# Patient Record
Sex: Male | Born: 1955 | Race: White | Hispanic: No | Smoking: Former smoker
Health system: Southern US, Community
[De-identification: ages and names within clinical notes are randomized; demographics above are authoritative.]

## PROBLEM LIST (undated history)

## (undated) DIAGNOSIS — M109 Gout, unspecified: Secondary | ICD-10-CM

## (undated) DIAGNOSIS — H269 Unspecified cataract: Secondary | ICD-10-CM

## (undated) DIAGNOSIS — G43909 Migraine, unspecified, not intractable, without status migrainosus: Secondary | ICD-10-CM

## (undated) DIAGNOSIS — N200 Calculus of kidney: Secondary | ICD-10-CM

## (undated) DIAGNOSIS — T7840XA Allergy, unspecified, initial encounter: Secondary | ICD-10-CM

## (undated) DIAGNOSIS — K5900 Constipation, unspecified: Secondary | ICD-10-CM

## (undated) DIAGNOSIS — F419 Anxiety disorder, unspecified: Secondary | ICD-10-CM

## (undated) DIAGNOSIS — G4730 Sleep apnea, unspecified: Secondary | ICD-10-CM

## (undated) DIAGNOSIS — D649 Anemia, unspecified: Secondary | ICD-10-CM

## (undated) DIAGNOSIS — F329 Major depressive disorder, single episode, unspecified: Secondary | ICD-10-CM

## (undated) DIAGNOSIS — F909 Attention-deficit hyperactivity disorder, unspecified type: Secondary | ICD-10-CM

## (undated) DIAGNOSIS — F32A Depression, unspecified: Secondary | ICD-10-CM

## (undated) HISTORY — DX: Allergy, unspecified, initial encounter: T78.40XA

## (undated) HISTORY — DX: Sleep apnea, unspecified: G47.30

## (undated) HISTORY — DX: Major depressive disorder, single episode, unspecified: F32.9

## (undated) HISTORY — DX: Attention-deficit hyperactivity disorder, unspecified type: F90.9

## (undated) HISTORY — DX: Constipation, unspecified: K59.00

## (undated) HISTORY — DX: Anxiety disorder, unspecified: F41.9

## (undated) HISTORY — DX: Unspecified cataract: H26.9

## (undated) HISTORY — DX: Depression, unspecified: F32.A

## (undated) HISTORY — DX: Calculus of kidney: N20.0

## (undated) HISTORY — PX: OTHER SURGICAL HISTORY: SHX169

## (undated) HISTORY — PX: REPLACEMENT TOTAL KNEE: SUR1224

## (undated) HISTORY — PX: COLONOSCOPY: SHX174

## (undated) HISTORY — PX: KNEE SURGERY: SHX244

## (undated) HISTORY — DX: Migraine, unspecified, not intractable, without status migrainosus: G43.909

## (undated) HISTORY — DX: Anemia, unspecified: D64.9

---
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
## 1998-07-13 DIAGNOSIS — N200 Calculus of kidney: Secondary | ICD-10-CM

## 1998-07-13 HISTORY — PX: LITHOTRIPSY: SUR834

## 1998-07-13 HISTORY — DX: Calculus of kidney: N20.0

## 1999-07-14 HISTORY — PX: BACK SURGERY: SHX140

## 2007-05-16 DIAGNOSIS — J309 Allergic rhinitis, unspecified: Secondary | ICD-10-CM

## 2007-06-21 DIAGNOSIS — J40 Bronchitis, not specified as acute or chronic: Secondary | ICD-10-CM

## 2008-02-16 DIAGNOSIS — M129 Arthropathy, unspecified: Secondary | ICD-10-CM | POA: Insufficient documentation

## 2008-06-25 DIAGNOSIS — M159 Polyosteoarthritis, unspecified: Secondary | ICD-10-CM

## 2009-06-24 DIAGNOSIS — S46819A Strain of other muscles, fascia and tendons at shoulder and upper arm level, unspecified arm, initial encounter: Secondary | ICD-10-CM

## 2009-06-24 DIAGNOSIS — S43499A Other sprain of unspecified shoulder joint, initial encounter: Secondary | ICD-10-CM

## 2010-10-16 ENCOUNTER — Ambulatory Visit (INDEPENDENT_AMBULATORY_CARE_PROVIDER_SITE_OTHER): Payer: BC Managed Care – PPO | Admitting: Family Medicine

## 2010-10-16 ENCOUNTER — Encounter: Payer: Self-pay | Admitting: Family Medicine

## 2010-10-16 DIAGNOSIS — G43909 Migraine, unspecified, not intractable, without status migrainosus: Secondary | ICD-10-CM

## 2010-10-16 DIAGNOSIS — R42 Dizziness and giddiness: Secondary | ICD-10-CM

## 2010-10-16 DIAGNOSIS — G4700 Insomnia, unspecified: Secondary | ICD-10-CM

## 2010-10-16 DIAGNOSIS — G4733 Obstructive sleep apnea (adult) (pediatric): Secondary | ICD-10-CM

## 2010-10-16 DIAGNOSIS — F5104 Psychophysiologic insomnia: Secondary | ICD-10-CM

## 2010-10-16 DIAGNOSIS — F988 Other specified behavioral and emotional disorders with onset usually occurring in childhood and adolescence: Secondary | ICD-10-CM

## 2010-10-16 NOTE — Patient Instructions (Signed)
Consider complete physical at some point later this year. 

## 2010-10-16 NOTE — Progress Notes (Signed)
  Subjective:    Patient ID: Ruben King, male    DOB: 1956-01-07, 55 y.o.   MRN: [id number]  HPI Patient seen to establish care. Past medical history reviewed. Prior history of kidney stones, migraine headaches, chronic insomnia, reported obstructive sleep apnea, and attention deficit disorder. He continues to see psychiatrist for all his medications.  Back surgery 2001 for some type of benign growth. 3 prior arthroscopic knee surgeries.  Family history significant for mother with bypass age 32. Father died of lung cancer. Mother with type 2 diabetes.  Patient is divorced. 2 stepchildren. Quit smoking 2 years ago. Occasional alcohol use. Colonoscopy 2008. Tetanus 2004. Patient works in [employer]  Acute issue of some intermittent vertigo past couple of weeks. Symptoms are very transient. No hearing changes. No tinnitus. No ataxia. No focal weakness. Denies headache.   Review of Systems  Constitutional: Negative for fever, chills, appetite change and fatigue.  HENT: Negative for hearing loss, congestion and tinnitus.   Eyes: Negative for visual disturbance.  Respiratory: Negative for cough and shortness of breath.   Cardiovascular: Negative for chest pain, palpitations and leg swelling.  Gastrointestinal: Negative for nausea, vomiting, abdominal pain, diarrhea and blood in stool.  Genitourinary: Negative for dysuria and hematuria.  Musculoskeletal: Negative for back pain, arthralgias and gait problem.  Skin: Negative for rash.  Neurological: Negative for dizziness, syncope and headaches.  Hematological: Negative for adenopathy.  Psychiatric/Behavioral: Positive for sleep disturbance. Negative for confusion and dysphoric mood.       Objective:   Physical Exam  [vitalsreviewed. Constitutional: He is oriented to person, place, and time. He appears well-developed and well-nourished.  HENT:  Head: Normocephalic and atraumatic.  Right Ear: External ear normal.  Left Ear:  External ear normal.  Mouth/Throat: Oropharynx is clear and moist. No oropharyngeal exudate.  Eyes: Pupils are equal, round, and reactive to light.  Neck: Neck supple. No thyromegaly present.  Cardiovascular: Normal rate, regular rhythm and normal heart sounds.   No murmur heard. Pulmonary/Chest: Effort normal and breath sounds normal. He has no wheezes. He has no rales. He exhibits no tenderness.  Abdominal: Soft. He exhibits no mass. There is no tenderness.  Musculoskeletal: He exhibits no edema.  Lymphadenopathy:    He has no cervical adenopathy.  Neurological: He is alert and oriented to person, place, and time. No cranial nerve deficit.       Cerebellar normal by finger to nose testing.  Skin: No rash noted.  Psychiatric: He has a normal mood and affect. His behavior is normal.          Assessment & Plan:  #1 attention deficit disorder #2 history of kidney stones #3 history of migraine headaches #4 history of chronic insomnia #5 reported history of obstructive sleep apnea #6 transient vertigo, suspect benign positional vertigo  Recent labs from outside reviewed. Patient will consider complete physical some point later this year. Discussed importance of weight loss.

## 2010-10-17 ENCOUNTER — Encounter: Payer: Self-pay | Admitting: Family Medicine

## 2010-10-17 DIAGNOSIS — G4733 Obstructive sleep apnea (adult) (pediatric): Secondary | ICD-10-CM | POA: Insufficient documentation

## 2010-10-17 DIAGNOSIS — F988 Other specified behavioral and emotional disorders with onset usually occurring in childhood and adolescence: Secondary | ICD-10-CM | POA: Insufficient documentation

## 2010-10-17 DIAGNOSIS — F5104 Psychophysiologic insomnia: Secondary | ICD-10-CM | POA: Insufficient documentation

## 2010-10-17 DIAGNOSIS — G43909 Migraine, unspecified, not intractable, without status migrainosus: Secondary | ICD-10-CM | POA: Insufficient documentation

## 2010-12-16 ENCOUNTER — Ambulatory Visit (INDEPENDENT_AMBULATORY_CARE_PROVIDER_SITE_OTHER): Payer: BC Managed Care – PPO | Admitting: Internal Medicine

## 2010-12-16 ENCOUNTER — Encounter: Payer: Self-pay | Admitting: Internal Medicine

## 2010-12-16 VITALS — BP 112/60 | HR 80 | Wt 287.0 lb

## 2010-12-16 DIAGNOSIS — M79675 Pain in left toe(s): Secondary | ICD-10-CM | POA: Insufficient documentation

## 2010-12-16 DIAGNOSIS — Z79899 Other long term (current) drug therapy: Secondary | ICD-10-CM

## 2010-12-16 DIAGNOSIS — M79609 Pain in unspecified limb: Secondary | ICD-10-CM

## 2010-12-16 DIAGNOSIS — M79676 Pain in unspecified toe(s): Secondary | ICD-10-CM

## 2010-12-16 MED ORDER — COLCHICINE 0.6 MG PO TABS: ORAL_TABLET | ORAL | Status: DC

## 2010-12-16 NOTE — Assessment & Plan Note (Signed)
Strongly suspect gout based on history and exam. Obtain uric acid level. Attempt colchicine prn. Provided with gout food handout.

## 2010-12-16 NOTE — Progress Notes (Signed)
  Subjective:    Patient ID: Ruben King, male    DOB: September 16, 1955, 55 y.o.   MRN: [id number]  HPI Pt presents to clinic for evaluation of left toe pain. Notes ~1wk h/o left first toe pain without injury or trauma. Initially had ST swelling, redness and warmth at the area now improved after motrin 800mg .  No known h/o gout. Pain now mild but at peak was severe and worse with movement or light touch. No other alleviating or exacerbating factors. No other complaints.  Reviewed pmh, medications and allergies    Review of Systems  Constitutional: Negative for fever and chills.  Musculoskeletal: Positive for joint swelling and arthralgias. Negative for gait problem.       Objective:   Physical Exam  [nursing notereviewed. Constitutional: He appears well-developed and well-nourished. No distress.  HENT:  Head: Normocephalic and atraumatic.  Right Ear: External ear normal.  Left Ear: External ear normal.  Nose: Nose normal.  Eyes: Conjunctivae are normal. No scleral icterus.  Musculoskeletal:       Mild ST swellling of left 1st toe base. NT. Slight erythema. FROM noted. Able to wt bear and ambulate without assistance.  Neurological: He is alert.  Skin: Skin is warm and dry. No rash noted. He is not diaphoretic. No erythema.  Psychiatric: He has a normal mood and affect.          Assessment & Plan:

## 2010-12-17 LAB — BASIC METABOLIC PANEL
CO2: 32 mEq/L (ref 19–32)
GFR: 73.91 mL/min (ref >60.00)
Glucose, Bld: 100 mg/dL — ABNORMAL HIGH (ref 70–99)
Potassium: 4.4 mEq/L (ref 3.5–5.1)
Sodium: 141 mEq/L (ref 135–145)

## 2010-12-19 ENCOUNTER — Telehealth: Payer: Self-pay

## 2010-12-19 NOTE — Telephone Encounter (Signed)
Left message for pt to call back  °

## 2010-12-19 NOTE — Telephone Encounter (Signed)
Pt.notified

## 2010-12-19 NOTE — Telephone Encounter (Signed)
Message copied by Beverely Low on Fri Dec 19, 2010  1:52 PM ------      Message from: Staci Righter      Created: Thu Dec 18, 2010  5:14 PM       Uric acid level is high suggestive of gout (as discussed)

## 2010-12-19 NOTE — Telephone Encounter (Signed)
Message copied by Beverely Low on Fri Dec 19, 2010  1:46 PM ------      Message from: Staci Righter      Created: Thu Dec 18, 2010  5:14 PM       Uric acid level is high suggestive of gout (as discussed)

## 2011-01-02 ENCOUNTER — Encounter: Payer: Self-pay | Admitting: *Deleted

## 2011-01-02 ENCOUNTER — Ambulatory Visit (INDEPENDENT_AMBULATORY_CARE_PROVIDER_SITE_OTHER): Payer: BC Managed Care – PPO | Admitting: Family Medicine

## 2011-01-02 ENCOUNTER — Encounter: Payer: Self-pay | Admitting: Family Medicine

## 2011-01-02 DIAGNOSIS — M7989 Other specified soft tissue disorders: Secondary | ICD-10-CM

## 2011-01-02 DIAGNOSIS — M109 Gout, unspecified: Secondary | ICD-10-CM | POA: Insufficient documentation

## 2011-01-02 MED ORDER — PREDNISONE (PAK) 10 MG PO TABS: ORAL_TABLET | ORAL | Status: DC

## 2011-01-02 MED ORDER — METHYLPREDNISOLONE ACETATE 80 MG/ML IJ SUSP
120.0000 mg | Freq: Once | INTRAMUSCULAR | Status: AC
  Administered 2011-01-02: 120 mg via INTRAMUSCULAR

## 2011-01-02 NOTE — Progress Notes (Signed)
  Subjective:    Patient ID: Ruben King, male    DOB: 1955/08/13, 55 y.o.   MRN: [id number]  HPI Here for 4 weeks of swelling and pain in the left foot which has not responded to Colchicine. He had the sudden onset of swelling and pain in the left great toe, then this had spread over the entire forefoot. No ankle involvement. No hx of trauma. His uric acid level was elevated. He is using Motrin as well.   Review of Systems  Constitutional: Negative.   Musculoskeletal: Positive for joint swelling and arthralgias.       Objective:   Physical Exam  Constitutional: He appears well-developed and well-nourished.  Musculoskeletal:       The left foot is not red or warm, but is swollen and very tender over the 2nd through the 5th MTP joints and the forefoot.           Assessment & Plan:  This is gout. We will stop Colchicine and start a prednisone taper pack. Stay off the foot this weekend. He will talk to Dr. Caryl Never about whether getting on a prophylactic agent like allopurinol would be helpful

## 2011-01-19 ENCOUNTER — Ambulatory Visit (INDEPENDENT_AMBULATORY_CARE_PROVIDER_SITE_OTHER): Payer: BC Managed Care – PPO | Admitting: Family Medicine

## 2011-01-19 ENCOUNTER — Encounter: Payer: Self-pay | Admitting: Family Medicine

## 2011-01-19 VITALS — BP 102/74 | Temp 98.5°F | Wt 290.0 lb

## 2011-01-19 DIAGNOSIS — M549 Dorsalgia, unspecified: Secondary | ICD-10-CM

## 2011-01-19 DIAGNOSIS — M546 Pain in thoracic spine: Secondary | ICD-10-CM

## 2011-01-19 MED ORDER — CYCLOBENZAPRINE HCL 10 MG PO TABS: 10.0000 mg | ORAL_TABLET | Freq: Three times a day (TID) | ORAL | Status: AC | PRN

## 2011-01-19 MED ORDER — HYDROCODONE-ACETAMINOPHEN 5-325 MG PO TABS: 1.0000 | ORAL_TABLET | Freq: Four times a day (QID) | ORAL | Status: AC | PRN

## 2011-01-19 NOTE — Progress Notes (Signed)
  Subjective:    Patient ID: Ruben King, male    DOB: 04/07/1956, 55 y.o.   MRN: [id number]  HPI Right shoulder and upper back pain. Onset July 5. No specific injury but noted after lifting heavy television July 4. Pain gradually worsening.  Achy quality.  9/10 severity. Radiate somewhat anterior right upper chest and posterior to trapezius. No real shoulder pain. Heat, ice, and ibuprofen 800 mg every 8 hours without much relief. Symptoms worse with neck flexion or lateral bending. No numbness or weakness. No other injuries reported   Review of Systems  Respiratory: Negative for cough and shortness of breath.   Cardiovascular: Negative for chest pain.  Skin: Negative for rash.  Neurological: Negative for weakness, numbness and headaches.  Hematological: Negative for adenopathy.       Objective:   Physical Exam  Constitutional: He appears well-developed and well-nourished. No distress.  Cardiovascular: Normal rate, regular rhythm and normal heart sounds.   No murmur heard. Pulmonary/Chest: Effort normal and breath sounds normal. No respiratory distress. He has no wheezes. He has no rales.  Musculoskeletal: He exhibits no edema.       Full range of motion right shoulder. Pain with neck flexion or lateral bending to the right or left or rotation. No spinal tenderness. Right trapezius muscle tension and tenderness. No upper extremity muscle atrophy  Neurological:       2+ symmetric reflexes upper extremities. No strength deficits.          Assessment & Plan:  Upper back pain. Suspect muscular. Nonfocal neuro exam. Continue heat or ice for symptom relief. Flexeril 10 mg each bedtime and limited hydrocodone 5/325 mg one every 6 hours as needed. Consider physical therapy if no improvement in 1-2 weeks

## 2011-01-19 NOTE — Patient Instructions (Signed)
Continue heat or ice for symptom relief. Touch base in 1-2 weeks if no better.

## 2011-01-28 ENCOUNTER — Encounter: Payer: Self-pay | Admitting: Family Medicine

## 2011-01-28 ENCOUNTER — Ambulatory Visit (INDEPENDENT_AMBULATORY_CARE_PROVIDER_SITE_OTHER): Payer: BC Managed Care – PPO | Admitting: Family Medicine

## 2011-01-28 DIAGNOSIS — M549 Dorsalgia, unspecified: Secondary | ICD-10-CM

## 2011-01-28 DIAGNOSIS — M546 Pain in thoracic spine: Secondary | ICD-10-CM

## 2011-01-28 DIAGNOSIS — M109 Gout, unspecified: Secondary | ICD-10-CM

## 2011-01-28 MED ORDER — PREDNISONE 10 MG PO TABS: ORAL_TABLET | ORAL | Status: AC

## 2011-01-28 MED ORDER — FEBUXOSTAT 40 MG PO TABS: 40.0000 mg | ORAL_TABLET | Freq: Every day | ORAL | Status: AC

## 2011-01-28 NOTE — Patient Instructions (Signed)
Start prednisone and continue with colchicine for acute flare. About 3-4 weeks after acute flare start Uloric (prevention medication)

## 2011-01-28 NOTE — Progress Notes (Signed)
  Subjective:    Patient ID: Ruben King, male    DOB: 1956-03-02, 55 y.o.   MRN: [id number]  HPI Recurrent swelling, pain, and redness left foot metatarsophalangeal joint. Recently diagnosed with gout. Mother has history of gout. No foot injury. Denies fever or chills. Colchicine 0.6 mg twice daily without improvement. Did see improvement previously with prednisone. No regular alcohol use. No clear dietary precipitants.  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 Only diagnosed earlier this year with gout. This would be at least his third flare up. No other joint involvement.  Still having some right upper back pain. Refer to prior note. No role shoulder pain at this time. Pain mostly mid aspect of trapezius. No radiculopathy symptoms. Full range of motion right shoulder. No numbness or weakness. Pain seems very localized to muscle. Topical rubs, massage, Flexeril without much improvement. Previously received injection for something similar which helped   Review of Systems  Constitutional: Negative for fever and chills.  Respiratory: Negative for cough and shortness of breath.   Cardiovascular: Negative for leg swelling.  Musculoskeletal: Positive for back pain, joint swelling and arthralgias.  Skin: Negative for rash.       Objective:   Physical Exam  Constitutional: He appears well-developed and well-nourished.  Cardiovascular: Normal rate and regular rhythm.   Pulmonary/Chest: Effort normal and breath sounds normal. No respiratory distress. He has no wheezes. He has no rales.  Musculoskeletal:       Swelling redness warmth and tenderness left foot metatarsophalangeal joint. No skin changes otherwise.  Patient has localized tenderness right trapezius muscle along the mid course. Increased tension to palpation. Full range of motion neck. Full range of motion right shoulder. No pain with internal rotation or abduction. No biceps tenderness. No a.c. joint  tenderness  Neurological:       Full-strength upper extremities. Symmetric upper extremity reflexes          Assessment & Plan:  #1 recurrent gout left foot. Prednisone taper. Discussed prevention. Dietary factors discussed. After acute flare subsides for 3-4 weeks start Uloric 40 mg daily and followup in 2 months to reassess and uric acid level then. Also continue colchicine 0.6 mg twice a day during acute flare #2 Right upper back pain. Suspect muscular. Localized trigger point. Discussed with patient risk and benefit of injection of trigger point and he consented. Skin prepped with Betadine. Area of tenderness localized. Injected 1 mL plain Xylocaine and an 40 mg of Medrol patient tolerated well.  Injected with 25-gauge 5/8 inch needle

## 2011-03-06 ENCOUNTER — Telehealth: Payer: Self-pay | Admitting: Family Medicine

## 2011-03-06 MED ORDER — COLCHICINE 0.6 MG PO TABS: ORAL_TABLET | ORAL | Status: DC

## 2011-03-06 NOTE — Telephone Encounter (Signed)
Pt on Colcrys for gout, Rx'd for him by Dr. Rodena Medin. He is out, but does not have an appt with Dr. Leonard Schwartz until 9/19. Wants to know if we can call in a refill of it to [facility] on [address].

## 2011-03-06 NOTE — Telephone Encounter (Signed)
Pt received #30 with 1 refill on 12/16/10 from Dr Rodena Medin.  He uses it as needed.  He has a gout flare, wears steel toes shoes and is in pain.

## 2011-03-06 NOTE — Telephone Encounter (Signed)
Ok to refill 

## 2011-04-01 ENCOUNTER — Ambulatory Visit (INDEPENDENT_AMBULATORY_CARE_PROVIDER_SITE_OTHER): Payer: BC Managed Care – PPO | Admitting: Family Medicine

## 2011-04-01 ENCOUNTER — Encounter: Payer: Self-pay | Admitting: Family Medicine

## 2011-04-01 DIAGNOSIS — Z23 Encounter for immunization: Secondary | ICD-10-CM

## 2011-04-01 DIAGNOSIS — M109 Gout, unspecified: Secondary | ICD-10-CM

## 2011-04-01 LAB — URIC ACID: Uric Acid, Serum: 4.4 mg/dL (ref 4.0–7.8)

## 2011-04-01 MED ORDER — COLCHICINE 0.6 MG PO TABS: ORAL_TABLET | ORAL | Status: DC

## 2011-04-01 NOTE — Patient Instructions (Signed)
Call if you want to change Uloric to allopurinol (generic similar to Uloric).

## 2011-04-01 NOTE — Progress Notes (Signed)
  Subjective:    Patient ID: Ruben King, male    DOB: 04/22/1956, 55 y.o.   MRN: [id number]  HPI Followup gout.  We started Uloric 40 mg daily and he is tolerated well. Still takes colchicine as needed. He has not had any acute flareups of the past couple of months. Needs repeat uric acid. Dietary modifications been discussed. No alcohol use. No diuretic use. Mostly has had feet involvement.   Review of Systems  Constitutional: Negative for fever and chills.  Musculoskeletal: Negative for myalgias, joint swelling and arthralgias.  Skin: Negative for rash.  Hematological: Negative for adenopathy.       Objective:   Physical Exam  Constitutional: He appears well-developed and well-nourished. No distress.  Cardiovascular: Normal rate, regular rhythm and normal heart sounds.   Pulmonary/Chest: Effort normal and breath sounds normal. No respiratory distress. He has no wheezes. He has no rales.  Musculoskeletal: He exhibits no edema.          Assessment & Plan:  Gout.  Repeat uric acid. Consider switch to generic allopurinol at some point in future for cough savings. Refilled colchicine.

## 2011-04-02 NOTE — Progress Notes (Signed)
Quick Note:  Pt informed ______ 

## 2011-06-09 ENCOUNTER — Telehealth: Payer: Self-pay | Admitting: Family Medicine

## 2011-06-09 MED ORDER — ALLOPURINOL 300 MG PO TABS: 300.0000 mg | ORAL_TABLET | Freq: Every day | ORAL | Status: DC

## 2011-06-09 NOTE — Telephone Encounter (Signed)
Pt called and is req cheaper med than febuxostat (ULORIC) 40 MG tablet. This med is too expensive. Pt said that Dr Caryl Never said that he would leave a note in system, that it is ok to change this med if pt calls.  [facility] [address] [facility] [address]

## 2011-06-09 NOTE — Telephone Encounter (Signed)
OV 04-01-11, may need to switch to Allopurinol in the future for cost savings.  Dose and sig please

## 2011-06-09 NOTE — Telephone Encounter (Signed)
D/C Uloric and start Allopurinol 300 mg po once daily.  Refill for 6 months.

## 2011-07-24 ENCOUNTER — Encounter: Payer: Self-pay | Admitting: Family Medicine

## 2011-07-24 ENCOUNTER — Ambulatory Visit (INDEPENDENT_AMBULATORY_CARE_PROVIDER_SITE_OTHER): Payer: BC Managed Care – PPO | Admitting: Family Medicine

## 2011-07-24 VITALS — BP 110/70 | Temp 98.4°F | Wt 286.0 lb

## 2011-07-24 DIAGNOSIS — N509 Disorder of male genital organs, unspecified: Secondary | ICD-10-CM

## 2011-07-24 DIAGNOSIS — N50819 Testicular pain, unspecified: Secondary | ICD-10-CM

## 2011-07-24 MED ORDER — CIPROFLOXACIN HCL 500 MG PO TABS: 500.0000 mg | ORAL_TABLET | Freq: Two times a day (BID) | ORAL | Status: AC

## 2011-07-24 NOTE — Progress Notes (Signed)
  Subjective:    Patient ID: Ruben King, male    DOB: [birth year], 56 y.o.   MRN: [id number]  HPI  Patient seen with somewhat bilateral testicular pain over the past couple months. No injury. Pain is bilateral. No edema. No rashes. No urinary symptoms. Pain is off-and-on. No exacerbating factors. Improved slightly with cold compresses. Ibuprofen 800 mg twice daily did not help. Denies any masses. Pain is intense at times   Review of Systems  Constitutional: Negative for chills, appetite change and unexpected weight change.  Genitourinary: Positive for testicular pain. Negative for dysuria, urgency and hematuria.  Skin: Negative for rash.  Hematological: Negative for adenopathy.       Objective:   Physical Exam  Constitutional: He appears well-developed and well-nourished.  Cardiovascular: Normal rate and regular rhythm.   Pulmonary/Chest: Effort normal and breath sounds normal. No respiratory distress. He has no wheezes. He has no rales.  Genitourinary:       Testes normal size and consistency with no masses. No hernia. He has slight tenderness left epididymis gland.          Assessment & Plan:  Testicular pain. Question epididymitis left testicle. Ciprofloxacin 500 mg twice a day for 10 days. Continue anti-inflammatory. Consider urology referral if no better in 2 weeks

## 2011-07-24 NOTE — Patient Instructions (Signed)
Touch base in 2 weeks if pain no better after finishing antibiotic.

## 2011-08-05 ENCOUNTER — Other Ambulatory Visit: Payer: Self-pay | Admitting: *Deleted

## 2011-08-05 MED ORDER — ALLOPURINOL 300 MG PO TABS: 300.0000 mg | ORAL_TABLET | Freq: Every day | ORAL | Status: DC

## 2011-12-25 ENCOUNTER — Encounter: Payer: Self-pay | Admitting: Family Medicine

## 2011-12-25 ENCOUNTER — Ambulatory Visit (INDEPENDENT_AMBULATORY_CARE_PROVIDER_SITE_OTHER): Payer: BC Managed Care – PPO | Admitting: Family Medicine

## 2011-12-25 VITALS — BP 110/68 | Temp 98.0°F | Wt 276.0 lb

## 2011-12-25 DIAGNOSIS — M546 Pain in thoracic spine: Secondary | ICD-10-CM

## 2011-12-25 MED ORDER — CYCLOBENZAPRINE HCL 10 MG PO TABS: 10.0000 mg | ORAL_TABLET | Freq: Three times a day (TID) | ORAL | Status: AC | PRN

## 2011-12-25 MED ORDER — DICLOFENAC SODIUM 75 MG PO TBEC: 75.0000 mg | DELAYED_RELEASE_TABLET | Freq: Two times a day (BID) | ORAL | Status: DC

## 2011-12-25 NOTE — Progress Notes (Signed)
  Subjective:    Patient ID: Ruben King, male    DOB: [birth year], 56 y.o.   MRN: [id number]  HPI  Acute visit. Patient seen with some pain mostly below the right scapular region. Onset past Monday. He thinks this may be overuse related to work. No specific injury. Some radiation toward the lumbar area. Pain is sharp. Relatively constant. 8/10 severity. Take Advil with no relief. No rash. Denies any dyspnea or cough. No fever. Worse with lifting. No chest pain.   Review of Systems  Constitutional: Negative for fever, chills, appetite change and unexpected weight change.  Respiratory: Negative for cough and shortness of breath.   Cardiovascular: Negative for chest pain.  Musculoskeletal: Positive for back pain.  Skin: Negative for rash.       Objective:   Physical Exam  Constitutional: He appears well-developed and well-nourished.  Neck: Neck supple. No thyromegaly present.  Cardiovascular: Normal rate and regular rhythm.   Pulmonary/Chest: Effort normal and breath sounds normal. No respiratory distress. He has no wheezes. He has no rales.  Musculoskeletal:       Patient has some mild tenderness below the right scapula latissimus dorsi region. No skin rashes. No swelling. No erythema. No ecchymosis. Full range of motion right shoulder. No areas of specific tenderness right shoulder.  Neurological:       Full-strength right shoulder          Assessment & Plan:  Right-sided thoracic back pain. Suspect muscular. Diclofenac 75 mg twice a day with food when necessary. Flexeril 10 mg each bedtime when necessary. Avoid heavy lifting. Touch base 2 weeks if no better

## 2012-03-30 ENCOUNTER — Encounter: Payer: Self-pay | Admitting: Family Medicine

## 2012-03-30 ENCOUNTER — Ambulatory Visit (INDEPENDENT_AMBULATORY_CARE_PROVIDER_SITE_OTHER): Payer: BC Managed Care – PPO | Admitting: Family Medicine

## 2012-03-30 VITALS — BP 110/74 | Temp 98.4°F | Wt 261.0 lb

## 2012-03-30 DIAGNOSIS — M542 Cervicalgia: Secondary | ICD-10-CM

## 2012-03-30 MED ORDER — PREDNISONE 10 MG PO TABS: ORAL_TABLET | ORAL | Status: DC

## 2012-03-30 NOTE — Progress Notes (Signed)
  Subjective:    Patient ID: Ruben King, male    DOB: 1955-09-14, 56 y.o.   MRN: [id number]  HPI  4 week history of right lower cervical neck pain with radiation to the shoulder and down occasionally to the arm. Occasional radiation toward the right scapula region. Denies any injury. Job requires some lifting. Pain is relatively constant. Achy quality. Progressive in intensity and occasionally 10 out 10 severity. No associated numbness or weakness. No clear exacerbating features. Ice worsens symptoms and heat helps. Using topical icy hot with some improvement. Minimal relief with ibuprofen. No prior history of significant cervical disc disease. No fevers or chills. Right-hand-dominant.   Review of Systems  Constitutional: Negative for fever and chills.  HENT: Positive for neck pain. Negative for neck stiffness.   Cardiovascular: Negative for chest pain.  Neurological: Negative for weakness and numbness.  Hematological: Negative for adenopathy.       Objective:   Physical Exam  Constitutional: He appears well-developed and well-nourished.  Neck: Neck supple. No thyromegaly present.  Cardiovascular: Normal rate and regular rhythm.  Exam reveals no gallop.   No murmur heard. Pulmonary/Chest: Effort normal and breath sounds normal. No respiratory distress. He has no wheezes. He has no rales.  Musculoskeletal:       Full range of motion cervical spine. Minimal pain with lateral bending to the right and left side. No spinal tenderness  For range of motion right shoulder. No pain with abduction or internal rotation. No biceps tenderness. No a.c. joint tenderness.  Lymphadenopathy:    He has no cervical adenopathy.  Neurological:       No upper extremity muscle atrophy. Full-strength. Symmetric reflexes. No sensory impairment.          Assessment & Plan:  Cervical neck pain. Probable radiculopathy symptoms. Non focal neuro exam. Trial prednisone taper. Consider cervical spine films  if not improving with this. Follow up sooner if any numbness or weakness or worsening pain

## 2012-03-30 NOTE — Patient Instructions (Addendum)
Touch base by next week if no better Be in touch sooner if any progressive weakness or numbness.

## 2012-04-14 ENCOUNTER — Encounter: Payer: Self-pay | Admitting: Family Medicine

## 2012-04-14 ENCOUNTER — Ambulatory Visit (INDEPENDENT_AMBULATORY_CARE_PROVIDER_SITE_OTHER): Payer: BC Managed Care – PPO | Admitting: Family Medicine

## 2012-04-14 ENCOUNTER — Ambulatory Visit (INDEPENDENT_AMBULATORY_CARE_PROVIDER_SITE_OTHER)
Admission: RE | Admit: 2012-04-14 | Discharge: 2012-04-14 | Disposition: A | Discharge status: Home / Self Care | Payer: BC Managed Care – PPO | Source: Ambulatory Visit | Attending: Family Medicine | Admitting: Family Medicine

## 2012-04-14 VITALS — BP 120/72 | Temp 98.0°F | Wt 260.0 lb

## 2012-04-14 DIAGNOSIS — M542 Cervicalgia: Secondary | ICD-10-CM

## 2012-04-14 MED ORDER — PREDNISONE 10 MG PO TABS: ORAL_TABLET | ORAL | Status: DC

## 2012-04-14 NOTE — Progress Notes (Signed)
  Subjective:    Patient ID: Ruben King, male    DOB: 1956-02-26, 56 y.o.   MRN: [id number]  HPI  Persistent cervicalgia. Refer to prior note. Prednisone helped substantially but after running out he's had recurrence of pain though not quite as severe as previous. He has achy pain that somewhat waxes and wanes. Worse with lifting. Radiates to her right scapula and occasionally down right upper extremity below elbow region. No numbness or weakness. Minimal relief with ibuprofen and topicals. No specific injury. Never had x-rays of his cervical spine.   Review of Systems  Constitutional: Negative for appetite change and unexpected weight change.  Neurological: Negative for weakness and numbness.       Objective:   Physical Exam  Constitutional: He appears well-developed and well-nourished.  Cardiovascular: Normal rate and regular rhythm.   Pulmonary/Chest: Effort normal and breath sounds normal. No respiratory distress. He has no wheezes. He has no rales.  Musculoskeletal:       Full range of motion cervical spine. No spinal tenderness.  Neurological:       Full-strength upper extremities. Symmetric reflexes upper extremities. Normal sensory function.          Assessment & Plan:  Cervicalgia with right upper extremity radiculopathy symptoms. Nonfocal neurologic exam. Refilled prednisone once. Cervical spine films given duration of symptoms over 2 months.

## 2012-04-15 NOTE — Progress Notes (Signed)
Quick Note:  Pt informed ______ 

## 2012-05-11 ENCOUNTER — Ambulatory Visit (INDEPENDENT_AMBULATORY_CARE_PROVIDER_SITE_OTHER): Payer: BC Managed Care – PPO | Admitting: Family Medicine

## 2012-05-11 ENCOUNTER — Encounter: Payer: Self-pay | Admitting: Family Medicine

## 2012-05-11 VITALS — BP 100/60 | Temp 98.4°F | Wt 254.0 lb

## 2012-05-11 DIAGNOSIS — R21 Rash and other nonspecific skin eruption: Secondary | ICD-10-CM

## 2012-05-11 DIAGNOSIS — M542 Cervicalgia: Secondary | ICD-10-CM

## 2012-05-11 MED ORDER — PREDNISONE 10 MG PO TABS: ORAL_TABLET | ORAL | Status: DC

## 2012-05-11 MED ORDER — HYDROCODONE-ACETAMINOPHEN 5-325 MG PO TABS: ORAL_TABLET | ORAL | Status: DC

## 2012-05-11 NOTE — Progress Notes (Addendum)
  Subjective:    Patient ID: Ruben King, male    DOB: 1956-05-25, 56 y.o.   MRN: [id number]  HPI  Followup regarding cervical neck pain. Refer to prior notes. Onset around mid August. No injury. Radiates from cervical spine to scapula and occasional right upper extremity. No weakness. Occasional fleeting numbness-arm and forearm. Pain at one point was 10 out of 10- currently 7-8/10. Has been on 2 courses of prednisone which has helped. Previously had tried ice, heat, topical rubs, and anti-inflammatory medication such as ibuprofen without much relief. 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 No upper extremity weakness. He's never had MRI cervical spine. Has had remote history of surgery lumbar spine  Separate problem of about one month history of nonpruritic scaly rash involving left ring finger.  Neosporin without relief.  No exacerbating factors.  Past Medical History  Diagnosis Date  . Anxiety   . Depression   . ADHD (attention deficit hyperactivity disorder)   . Sleep apnea   . Migraines    Past Surgical History  Procedure Date  . Tonsillectomy 1961  . Back surgery 2001  . Knee surgery     X 3    reports that he quit smoking about 3 years ago. His smoking use included Cigarettes. He has a 2.5 pack-year smoking history. He does not have any smokeless tobacco history on file. His alcohol and drug histories not on file. family history includes Arthritis in his mother; Cancer in his father; Diabetes in his maternal grandmother and mother; Heart disease in his maternal grandfather and maternal grandmother; and Heart disease (age of onset:70) in his mother. Allergies  Allergen Reactions  . Penicillins     anaphylisis  . Sulfa Antibiotics     As a child      Review of Systems  Constitutional: Negative for fever, chills, appetite change and unexpected weight change.  Respiratory: Negative for cough.   Cardiovascular:  Negative for chest pain.  Neurological: Negative for weakness.       Objective:   Physical Exam  Constitutional: He appears well-developed and well-nourished.  Cardiovascular: Normal rate and regular rhythm.   Pulmonary/Chest: Effort normal and breath sounds normal. No respiratory distress. He has no wheezes. He has no rales.  Musculoskeletal:       No Upper extremity muscle atrophy.    Neurological:       Full-strength upper extremities. Symmetric upper extremity reflexes. Normal sensory function to touch.  Skin:       Left ring finger reveals scaly rash involving distal one half. No erythema. No pustules. No vesicles.          Assessment & Plan:  #1 cervical spine pain-with RUE radiculopathy.  Patient has severe degenerative changes C 6-7. We tried multiple conservative therapies he's had some temporary improvement with prednisone but each time has had recurrent symptoms after this wears off. MRI cervical spine to further evaluate. #2 skin rash left ring finger. Question fungal. Try over-the-counter Lamisil and touch base 2-3 weeks if no improvement. Does not appear typical for dyshidrosis. 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  Patient notified. Set up a neurosurgical consultation and patient agrees

## 2012-05-25 ENCOUNTER — Ambulatory Visit
Admission: RE | Admit: 2012-05-25 | Discharge: 2012-05-25 | Disposition: A | Discharge status: Home / Self Care | Payer: BC Managed Care – PPO | Source: Ambulatory Visit | Attending: Family Medicine | Admitting: Family Medicine

## 2012-05-25 DIAGNOSIS — M542 Cervicalgia: Secondary | ICD-10-CM

## 2012-05-25 NOTE — Addendum Note (Signed)
Addended by: Kristian Covey on: 05/25/2012 05:44 PM   Modules accepted: Orders

## 2012-06-01 ENCOUNTER — Ambulatory Visit (INDEPENDENT_AMBULATORY_CARE_PROVIDER_SITE_OTHER): Payer: BC Managed Care – PPO | Admitting: Family Medicine

## 2012-06-01 ENCOUNTER — Encounter: Payer: Self-pay | Admitting: Family Medicine

## 2012-06-01 VITALS — BP 110/58 | Temp 98.0°F | Wt 250.0 lb

## 2012-06-01 DIAGNOSIS — S90829A Blister (nonthermal), unspecified foot, initial encounter: Secondary | ICD-10-CM

## 2012-06-01 DIAGNOSIS — Z139 Encounter for screening, unspecified: Secondary | ICD-10-CM

## 2012-06-01 DIAGNOSIS — IMO0002 Reserved for concepts with insufficient information to code with codable children: Secondary | ICD-10-CM

## 2012-06-01 NOTE — Progress Notes (Signed)
Subjective:     Patient ID: Ruben King, male   DOB: [birth year], 56 y.o.   MRN: [id number]  HPI 56 year old here for evaluation of blisters on feet.  States that he had a blister on L heel recently, and noticed a blister on R foot this AM because it was painful, had not noticed it before today.  On his feet frequently in steel-toed boots for his job.  Has had intermittent bilateral leg numbness, tingling, and some neuropathic pain since lower back surgery 56 years ago, but he says this has remained stable and he does not take medication.  Has had current work shoes for 2-3 months, and has not started wearing any new type of socks and cannot recall any trauma.  Review of Systems  Skin:       Blisters on feet.  Neurological: Positive for numbness (occasional in legs since lumbar back surgery 56 years ago.).       Objective:   Physical Exam  Cardiovascular:       DP pulses 2+ bilaterally.   Neurological:       Monofilament test mostly normal bilaterally.  1-2 missed spots on R foot in area of noted bulla.   Skin:       Bulla partially blood filled noted on bottom of R foot, just posterior to first web space, approx 2x3cm.  No surrounding erythema or fluctuance.  Small ecchymoses noted under toenails of 1st and 3rd toes.  No bullae on L foot, but bony prominence or exostosis on lateral aspect of calcaneus.  No signs of wound or infection.       Assessment:     56 year old here for evaluation of foot blisters.    Plan:     1. Blisters: likely friction-related.  Pt is on his feet frequently at work in [employer], which he notes is the source of the bruising under his R toenails.  Likely not infected as there is no fluctuance or erythema.  Discourage unroofing blister or lancing.  Could try rubbing Vaseline on bottoms of feet before work each day to reduce friction.  Follow up if erythema or purulence develops, or pain continues to increase.  If this issue continues, may consider  referral to podiatrist for further management.  Marthann Schiller, MS3     Agree with assessment and plan as per Marthann Schiller, MS 3 No infection.  CBG normal (92) Evelena Peat MD

## 2012-06-01 NOTE — Patient Instructions (Signed)
Continue to monitor the blister on your right foot.  Follow up if the skin around it becomes red, pus develops, or it becomes increasingly painful, as this could indicate infection.

## 2012-08-06 ENCOUNTER — Other Ambulatory Visit: Payer: Self-pay | Admitting: Family Medicine

## 2012-09-29 ENCOUNTER — Ambulatory Visit (INDEPENDENT_AMBULATORY_CARE_PROVIDER_SITE_OTHER): Payer: BC Managed Care – PPO | Admitting: Family Medicine

## 2012-09-29 ENCOUNTER — Encounter: Payer: Self-pay | Admitting: Family Medicine

## 2012-09-29 VITALS — BP 98/72 | Temp 98.5°F | Wt 244.0 lb

## 2012-09-29 NOTE — Patient Instructions (Addendum)
Follow up for any hearing loss, ringing in the ears, or worsening pain.

## 2012-09-29 NOTE — Progress Notes (Signed)
  Subjective:    Patient ID: Ruben King, male    DOB: September 24, 1955, 57 y.o.   MRN: [id number]  HPI Bilateral otalgia Onset about 2 days ago. Left ear greater than right. Fleeting sharp quality pain. No clear triggers. Denies associated tinnitus, hearing changes, or any ear drainage. Minimal sore throat. No pain with swallowing. Pain not triggered with eating. No known history of TMJ. No skin rashes. No fever.  Takes ibuprofen for chronic back issues. No alleviating factors. In   Review of Systems  Constitutional: Negative for appetite change and unexpected weight change.  HENT: Positive for ear pain. Negative for hearing loss, congestion, trouble swallowing, tinnitus and ear discharge.   Respiratory: Negative for cough and shortness of breath.   Cardiovascular: Negative for chest pain.  Hematological: Negative for adenopathy.       Objective:   Physical Exam  Constitutional: He appears well-developed and well-nourished.  HENT:  Right Ear: External ear normal.  Left Ear: External ear normal.  Mouth/Throat: Oropharynx is clear and moist.  Patient has minimal tenderness over left TMJ joint. He has some noted malalignment of mandibular joint.  Neck: Neck supple. No thyromegaly present.  Cardiovascular: Normal rate and regular rhythm.   Pulmonary/Chest: Effort normal and breath sounds normal. No respiratory distress. He has no wheezes. He has no rales.  Lymphadenopathy:    He has no cervical adenopathy.  Skin: No rash noted.          Assessment & Plan:  Otalgia. Normal ear exam. No evidence for infection. Question radiation from TMJ joint. Continue anti-inflammatories. At this point no clear association with eating. Followup if symptoms persist or worsen

## 2013-02-22 ENCOUNTER — Ambulatory Visit (INDEPENDENT_AMBULATORY_CARE_PROVIDER_SITE_OTHER): Payer: Self-pay | Admitting: Family Medicine

## 2013-02-22 ENCOUNTER — Encounter: Payer: Self-pay | Admitting: Family Medicine

## 2013-02-22 VITALS — BP 122/78 | HR 83 | Temp 98.9°F | Wt 237.0 lb

## 2013-02-22 DIAGNOSIS — H9313 Tinnitus, bilateral: Secondary | ICD-10-CM

## 2013-02-22 DIAGNOSIS — H9319 Tinnitus, unspecified ear: Secondary | ICD-10-CM

## 2013-02-22 NOTE — Patient Instructions (Addendum)
Tinnitus  Sounds you hear in your ears and coming from within the ear is called tinnitus. This can be a symptom of many ear disorders. It is often associated with hearing loss.   Tinnitus can be seen with:  · Infections.  · Ear blockages such as wax buildup.  · Meniere's disease.  · Ear damage.  · Inherited.  · Occupational causes.  While irritating, it is not usually a threat to health. When the cause of the tinnitus is wax, infection in the middle ear, or foreign body it is easily treated. Hearing loss will usually be reversible.   TREATMENT   When treating the underlying cause does not get rid of tinnitus, it may be necessary to get rid of the unwanted sound by covering it up with more pleasant background noises. This may include music, the radio etc. There are tinnitus maskers which can be worn which produce background noise to cover up the tinnitus.  Avoid all medications which tend to make tinnitus worse such as alcohol, caffeine, aspirin, and nicotine. There are many soothing background tapes such as rain, ocean, thunderstorms, etc. These soothing sounds help with sleeping or resting.  Keep all follow-up appointments and referrals. This is important to identify the cause of the problem. It also helps avoid complications, impaired hearing, disability, or chronic pain.  Document Released: 06/29/2005 Document Revised: 09/21/2011 Document Reviewed: 02/15/2008  Exit[employer]® Patient Information ©2014 [employer].

## 2013-02-22 NOTE — Progress Notes (Signed)
  Subjective:    Patient ID: Ruben King, male    DOB: 1955-12-02, 57 y.o.   MRN: [id number]  HPI Acute visit Approximately 5 week history of bilateral tinnitus. Denies any hearing changes. No vertigo. No aspirin use. 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 He has recently started wearing earplugs. He gets his hearing tested yearly through work and has not had any impediments previously. Denies any headaches. No balance problems.  Past Medical History  Diagnosis Date  . Anxiety   . Depression   . ADHD (attention deficit hyperactivity disorder)   . Sleep apnea   . Migraines    Past Surgical History  Procedure Laterality Date  . Tonsillectomy  1961  . Back surgery  2001  . Knee surgery      X 3    reports that he quit smoking about 4 years ago. His smoking use included Cigarettes. He has a 2.5 pack-year smoking history. He does not have any smokeless tobacco history on file. His alcohol and drug histories are not on file. family history includes Arthritis in his mother; Cancer in his father; Diabetes in his maternal grandmother and mother; Heart disease in his maternal grandfather and maternal grandmother; Heart disease (age of onset: 54) in his mother. Allergies  Allergen Reactions  . Penicillins     anaphylisis  . Sulfa Antibiotics     As a child      Review of Systems  Constitutional: Negative for fever and chills.  HENT: Positive for tinnitus. Negative for hearing loss and ear pain.   Neurological: Negative for dizziness and headaches.       Objective:   Physical Exam  Constitutional: He is oriented to person, place, and time. He appears well-developed and well-nourished.  HENT:  Right Ear: External ear normal.  Left Ear: External ear normal.  Neck: Neck supple. No thyromegaly present.  Cardiovascular: Normal rate and regular rhythm.   Pulmonary/Chest: Effort normal and breath sounds normal. No respiratory distress. He has no  wheezes. He has no rales.  Lymphadenopathy:    He has no cervical adenopathy.  Neurological: He is alert and oriented to person, place, and time. No cranial nerve deficit. Coordination normal.          Assessment & Plan:  Bilateral tinnitus. Suspect occupational. Normal exam. Continue with yearly hearing screen. Followup promptly for any hearing loss, vertigo, or new symptoms

## 2013-04-10 ENCOUNTER — Ambulatory Visit (INDEPENDENT_AMBULATORY_CARE_PROVIDER_SITE_OTHER): Payer: BC Managed Care – PPO | Admitting: Family Medicine

## 2013-04-10 ENCOUNTER — Encounter: Payer: Self-pay | Admitting: Family Medicine

## 2013-04-10 VITALS — BP 110/64 | HR 75 | Temp 98.4°F | Wt 248.0 lb

## 2013-04-10 DIAGNOSIS — M25569 Pain in unspecified knee: Secondary | ICD-10-CM

## 2013-04-10 DIAGNOSIS — M25561 Pain in right knee: Secondary | ICD-10-CM

## 2013-04-10 MED ORDER — HYDROCODONE-ACETAMINOPHEN 5-325 MG PO TABS: ORAL_TABLET | ORAL | Status: DC

## 2013-04-10 NOTE — Progress Notes (Signed)
  Subjective:    Patient ID: Ruben King, male    DOB: [birth year], 57 y.o.   MRN: [id number]  HPI Patient seen with right knee pain. He's had 2 previous arthroscopic surgeries left knee and one previous involving right knee. Current right knee pain onset last week after episode of exercise. He denies specific injury. He has a new job that requires a lot of walking. He noticed pain involving the medial aspect of the knee. Mild edema. No locking or giving way. No ecchymosis. Location is medial aspect without radiation. Tried ibuprofen with minimal relief. Night pain is worse. He has pending appointment with orthopedist October 14.  Past Medical History  Diagnosis Date  . Anxiety   . Depression   . ADHD (attention deficit hyperactivity disorder)   . Sleep apnea   . Migraines    Past Surgical History  Procedure Laterality Date  . Tonsillectomy  1961  . Back surgery  2001  . Knee surgery Bilateral     X 3    reports that he quit smoking about 4 years ago. His smoking use included Cigarettes. He has a 2.5 pack-year smoking history. He does not have any smokeless tobacco history on file. His alcohol and drug histories are not on file. family history includes Arthritis in his mother; Cancer in his father; Diabetes in his maternal grandmother and mother; Heart disease in his maternal grandfather and maternal grandmother; Heart disease (age of onset: 16) in his mother. Allergies  Allergen Reactions  . Penicillins     anaphylisis  . Sulfa Antibiotics     As a child      Review of Systems  Constitutional: Negative for fever and chills.  Musculoskeletal: Negative for gait problem.  Skin: Negative for rash.  Neurological: Negative for weakness.       Objective:   Physical Exam  Constitutional: He appears well-developed.  Cardiovascular: Normal rate and regular rhythm.   Pulmonary/Chest: Effort normal and breath sounds normal. No respiratory distress. He has no wheezes. He has no  rales.  Musculoskeletal:  Right knee reveals no ecchymosis. No erythema. Full range of motion. No definite effusion. He has some mild prominence right medial femoral condyle region compared to the left but no localizing tenderness. Is very minimal medial joint space tenderness. No lateral tenderness. Ligament testing is normal. No popliteal swelling.          Assessment & Plan:  Nonspecific medial right knee pain without significant effusion. Question medial meniscal irritation. Plain x-rays obtained. He has pending orthopedic referral October 14. Continue Ibuprofen.  Limited hydrocodone 5 mg one to 2 every 6 hours a day for severe pain #30 with no refill

## 2013-04-11 ENCOUNTER — Ambulatory Visit (INDEPENDENT_AMBULATORY_CARE_PROVIDER_SITE_OTHER)
Admission: RE | Admit: 2013-04-11 | Discharge: 2013-04-11 | Disposition: A | Discharge status: Home / Self Care | Payer: BC Managed Care – PPO | Source: Ambulatory Visit | Attending: Family Medicine | Admitting: Family Medicine

## 2013-04-11 DIAGNOSIS — M25569 Pain in unspecified knee: Secondary | ICD-10-CM

## 2013-04-11 DIAGNOSIS — M25561 Pain in right knee: Secondary | ICD-10-CM

## 2013-08-15 ENCOUNTER — Encounter: Payer: Self-pay | Admitting: Family Medicine

## 2013-08-15 ENCOUNTER — Ambulatory Visit (INDEPENDENT_AMBULATORY_CARE_PROVIDER_SITE_OTHER): Payer: BC Managed Care – PPO | Admitting: Family Medicine

## 2013-08-15 VITALS — BP 110/80 | HR 79 | Temp 98.5°F | Wt 259.0 lb

## 2013-08-15 DIAGNOSIS — M7580 Other shoulder lesions, unspecified shoulder: Secondary | ICD-10-CM

## 2013-08-15 DIAGNOSIS — M719 Bursopathy, unspecified: Secondary | ICD-10-CM

## 2013-08-15 DIAGNOSIS — M67919 Unspecified disorder of synovium and tendon, unspecified shoulder: Secondary | ICD-10-CM

## 2013-08-15 MED ORDER — METHYLPREDNISOLONE ACETATE 40 MG/ML IJ SUSP
40.0000 mg | Freq: Once | INTRAMUSCULAR | Status: AC
  Administered 2013-08-15: 40 mg via INTRA_ARTICULAR

## 2013-08-15 NOTE — Progress Notes (Signed)
Pre visit review using our clinic review tool, if applicable. No additional management support is needed unless otherwise documented below in the visit note. 

## 2013-08-15 NOTE — Progress Notes (Signed)
   Subjective:    Patient ID: Ruben King, male    DOB: 11/15/1955, 58 y.o.   MRN: [id number]  HPI Left shoulder pain. Duration approximately one month. No injury. He is at goal sharp pain that is worse with internal rotation, abduction, and external rotation. Radiation toward the left deltoid region. No weakness. No numbness. No cervical neck pain. Symptoms have been progressive over one month. Has tried Advil without relief. Exacerbated by movements above.  Past Medical History  Diagnosis Date  . Anxiety   . Depression   . ADHD (attention deficit hyperactivity disorder)   . Sleep apnea   . Migraines    Past Surgical History  Procedure Laterality Date  . Tonsillectomy  1961  . Back surgery  2001  . Knee surgery Bilateral     X 3    reports that he quit smoking about 4 years ago. His smoking use included Cigarettes. He has a 2.5 pack-year smoking history. He does not have any smokeless tobacco history on file. His alcohol and drug histories are not on file. family history includes Arthritis in his mother; Cancer in his father; Diabetes in his maternal grandmother and mother; Heart disease in his maternal grandfather and maternal grandmother; Heart disease (age of onset: 970) in his mother. Allergies  Allergen Reactions  . Penicillins     anaphylisis  . Sulfa Antibiotics     As a child       Review of Systems  Cardiovascular: Negative for chest pain.  Neurological: Negative for weakness and numbness.       Objective:   Physical Exam  Constitutional: He appears well-developed and well-nourished.  Cardiovascular: Normal rate.   Pulmonary/Chest: Effort normal and breath sounds normal. No respiratory distress. He has no wheezes. He has no rales.  Musculoskeletal:  Left shoulder reveals full range of motion. He has pain with abduction against resistance and internal rotation and external rotation. No biceps tenderness. No a.c. joint tenderness.  Neurological:    Full-strength left rotator cuff. Symmetric upper extremity reflexes. Normal sensory function.          Assessment & Plan:  Left rotator cuff tendinitis .   Discussed risks and benefits of corticosteroid injection and patient consented.  After prepping skin with betadine, injected 40 mg depomedrol and 2 cc of plain xylocaine with 23 gauge one and one half inch needle using posterior lateral approach and pt tolerate well. Instructed in gentle range of motion. Touch base 2 weeks if no better. Patient did receive some immediate improvement afterwards

## 2013-08-15 NOTE — Patient Instructions (Signed)
Rotator Cuff Tendinitis  Rotator cuff tendinitis is inflammation of the tough, cord-like bands that connect muscle to bone (tendons) in your rotator cuff. Your rotator cuff is the collection of all the muscles and tendons that connect your arm to your shoulder. Your rotator cuff holds the head of your upper arm bone (humerus) in the cup (fossa) of your shoulder blade (scapula). CAUSES Rotator cuff tendinitis is usually caused by overusing the joint involved.  SIGNS AND SYMPTOMS  Deep ache in the shoulder also felt on the outside upper arm over the shoulder muscle.  Point tenderness over the area that is injured.  Pain comes on gradually and becomes worse with lifting the arm to the side (abduction) or turning it inward (internal rotation).  May lead to a chronic tear: When a rotator cuff tendon becomes inflamed, it runs the risk of losing its blood supply, causing some tendon fibers to die. This increases the risk that the tendon can fray and partially or completely tear. DIAGNOSIS Rotator cuff tendinitis is diagnosed by taking a medical history, performing a physical exam, and reviewing results of imaging exams. The medical history is useful to help determine the type of rotator cuff injury. The physical exam will include looking at the injured shoulder, feeling the injured area, and watching you do range-of-motion exercises. X-ray exams are typically done to rule out other causes of shoulder pain, such as fractures. MRI is the imaging exam usually used for significant shoulder injuries. Sometimes a dye study called CT arthrogram is done, but it is not as widely used as MRI. In some institutions, special ultrasound tests may also be used to aid in the diagnosis. TREATMENT  Less Severe Cases  Use of a sling to rest the shoulder for a short period of time. Prolonged use of the sling can cause stiffness, weakness, and loss of motion of the shoulder joint.  Anti-inflammatory medicines, such as  ibuprofen or naproxen sodium, may be prescribed. More Severe Cases  Physical therapy.  Use of steroid injections into the shoulder joint.  Surgery. HOME CARE INSTRUCTIONS   Use a sling or splint until the pain decreases. Prolonged use of the sling can cause stiffness, weakness, and loss of motion of the shoulder joint.  Apply ice to the injured area:  Put ice in a plastic bag.  Place a towel between your skin and the bag.  Leave the ice on for 20 minutes, 2 3 times a day.  Try to avoid use other than gentle range of motion while your shoulder is painful. Use the shoulder and exercise only as directed by your health care provider. Stop exercises or range of motion if pain or discomfort increases, unless directed otherwise by your health care provider.  Only take over-the-counter or prescription medicines for pain, discomfort, or fever as directed by your health care provider.  If you were given a shoulder sling and straps (immobilizer), do not remove it except as directed, or until you see a health care provider for a follow-up exam. If you need to remove it, move your arm as little as possible or as directed.  You may want to sleep on several pillows at night to lessen swelling and pain. SEEK IMMEDIATE MEDICAL CARE IF:   Your shoulder pain increases or new pain develops in your arm, hand, or fingers and is not relieved with medicines.  You have new, unexplained symptoms, especially increased numbness in the hands or loss of strength.  You develop any worsening of the   problems that brought you in for care.  Your arm, hand, or fingers are numb or tingling.  Your arm, hand, or fingers are swollen, painful, or turn white or blue. MAKE SURE YOU:  Understand these instructions.  Will watch your condition.  Will get help right away if you are not doing well or get worse. Document Released: 09/19/2003 Document Revised: 04/19/2013 Document Reviewed: 02/08/2013 [employer] Patient  Information 2014 [employer].  

## 2013-08-27 ENCOUNTER — Other Ambulatory Visit: Payer: Self-pay | Admitting: Family Medicine

## 2014-01-25 ENCOUNTER — Encounter: Payer: Self-pay | Admitting: Family Medicine

## 2014-01-25 ENCOUNTER — Ambulatory Visit (INDEPENDENT_AMBULATORY_CARE_PROVIDER_SITE_OTHER): Payer: BC Managed Care – PPO | Admitting: Family Medicine

## 2014-01-25 VITALS — BP 120/78 | HR 73 | Temp 98.5°F | Wt 261.0 lb

## 2014-01-25 DIAGNOSIS — M545 Low back pain, unspecified: Secondary | ICD-10-CM

## 2014-01-25 MED ORDER — DICLOFENAC SODIUM 75 MG PO TBEC: 75.0000 mg | DELAYED_RELEASE_TABLET | Freq: Two times a day (BID) | ORAL | Status: DC

## 2014-01-25 MED ORDER — HYDROCODONE-ACETAMINOPHEN 5-325 MG PO TABS: 1.0000 | ORAL_TABLET | Freq: Four times a day (QID) | ORAL | Status: DC | PRN

## 2014-01-25 NOTE — Patient Instructions (Signed)

## 2014-01-25 NOTE — Progress Notes (Signed)
   Subjective:    Patient ID: Ruben King, male    DOB: 10/12/1955, 58 y.o.   MRN: [id number]  Back Pain Pertinent negatives include no abdominal pain, chest pain, dysuria, fever, numbness or weakness.   Patient seen with low back pain. Onset couple days ago. Had remote history of low back surgery several years ago. He just returned to work after bilateral carpal tunnel surgery. His current pain is lower lumbar area poorly localized. Occasional pains radiating to right buttock. Achy quality 8/10 severity. He's tried ibuprofen without much relief. No urine or stool incontinence. Symptoms slightly worse with walking and slightly at rest. He went to physical therapist yesterday for his carpal tunnel rehabilitation and they gave him some back stretches.  Past Medical History  Diagnosis Date  . Anxiety   . Depression   . ADHD (attention deficit hyperactivity disorder)   . Sleep apnea   . Migraines    Past Surgical History  Procedure Laterality Date  . Tonsillectomy  1961  . Back surgery  2001  . Knee surgery Bilateral     X 3    reports that he quit smoking about 5 years ago. His smoking use included Cigarettes. He has a 2.5 pack-year smoking history. He does not have any smokeless tobacco history on file. His alcohol and drug histories are not on file. family history includes Arthritis in his mother; Cancer in his father; Diabetes in his maternal grandmother and mother; Heart disease in his maternal grandfather and maternal grandmother; Heart disease (age of onset: 5870) in his mother. Allergies  Allergen Reactions  . Penicillins     anaphylisis  . Sulfa Antibiotics     As a child      Review of Systems  Constitutional: Negative for fever, activity change, appetite change and unexpected weight change.  Respiratory: Negative for cough and shortness of breath.   Cardiovascular: Negative for chest pain and leg swelling.  Gastrointestinal: Negative for vomiting and abdominal pain.    Genitourinary: Negative for dysuria, hematuria and flank pain.  Musculoskeletal: Positive for back pain. Negative for joint swelling.  Neurological: Negative for weakness and numbness.       Objective:   Physical Exam  Constitutional: He is oriented to person, place, and time. He appears well-developed and well-nourished. No distress.  Neck: No thyromegaly present.  Cardiovascular: Normal rate, regular rhythm and normal heart sounds.   No murmur heard. Pulmonary/Chest: Effort normal and breath sounds normal. No respiratory distress. He has no wheezes. He has no rales.  Musculoskeletal: He exhibits no edema.  Neurological: He is alert and oriented to person, place, and time. He has normal reflexes. No cranial nerve deficit.  Full-strength lower extremities  Skin: No rash noted.          Assessment & Plan:  Lumbar back pain. Nonfocal exam neurologically. Trial of diclofenac 75 mg twice a day with food. Stretches as previously instructed. Limited Vicodin 5 mg one to 2 every 6 hours for severe pain #30 with no refill

## 2014-01-25 NOTE — Progress Notes (Signed)
Pre visit review using our clinic review tool, if applicable. No additional management support is needed unless otherwise documented below in the visit note. 

## 2014-02-11 ENCOUNTER — Other Ambulatory Visit: Payer: Self-pay | Admitting: Family Medicine

## 2014-02-21 ENCOUNTER — Other Ambulatory Visit: Payer: Self-pay | Admitting: Neurosurgery

## 2014-02-21 DIAGNOSIS — M5126 Other intervertebral disc displacement, lumbar region: Secondary | ICD-10-CM

## 2014-03-01 ENCOUNTER — Other Ambulatory Visit: Payer: BC Managed Care – PPO

## 2014-03-09 ENCOUNTER — Other Ambulatory Visit: Payer: BC Managed Care – PPO

## 2014-03-10 ENCOUNTER — Ambulatory Visit
Admission: RE | Admit: 2014-03-10 | Discharge: 2014-03-10 | Disposition: A | Discharge status: Home / Self Care | Payer: BC Managed Care – PPO | Source: Ambulatory Visit | Attending: Neurosurgery | Admitting: Neurosurgery

## 2014-03-10 DIAGNOSIS — M5126 Other intervertebral disc displacement, lumbar region: Secondary | ICD-10-CM

## 2014-03-10 MED ORDER — GADOBENATE DIMEGLUMINE 529 MG/ML IV SOLN
20.0000 mL | Freq: Once | INTRAVENOUS | Status: AC | PRN
  Administered 2014-03-10: 20 mL via INTRAVENOUS

## 2014-05-14 ENCOUNTER — Other Ambulatory Visit: Payer: Self-pay | Admitting: Family Medicine

## 2014-08-06 ENCOUNTER — Other Ambulatory Visit: Payer: Self-pay | Admitting: Family Medicine

## 2014-10-28 DIAGNOSIS — M1711 Unilateral primary osteoarthritis, right knee: Secondary | ICD-10-CM | POA: Insufficient documentation

## 2014-11-07 ENCOUNTER — Other Ambulatory Visit: Payer: Self-pay | Admitting: Family Medicine

## 2014-12-19 ENCOUNTER — Encounter: Payer: Self-pay | Admitting: Family Medicine

## 2014-12-19 ENCOUNTER — Ambulatory Visit (INDEPENDENT_AMBULATORY_CARE_PROVIDER_SITE_OTHER): Payer: BLUE CROSS/BLUE SHIELD | Admitting: Family Medicine

## 2014-12-19 VITALS — BP 120/84 | HR 78 | Temp 98.3°F | Wt 258.0 lb

## 2014-12-19 DIAGNOSIS — M5412 Radiculopathy, cervical region: Secondary | ICD-10-CM

## 2014-12-19 MED ORDER — PREDNISONE 10 MG PO TABS: ORAL_TABLET | ORAL | Status: DC

## 2014-12-19 MED ORDER — HYDROCODONE-ACETAMINOPHEN 5-325 MG PO TABS: 1.0000 | ORAL_TABLET | Freq: Four times a day (QID) | ORAL | Status: DC | PRN

## 2014-12-19 MED ORDER — METHOCARBAMOL 500 MG PO TABS: 500.0000 mg | ORAL_TABLET | Freq: Three times a day (TID) | ORAL | Status: DC | PRN

## 2014-12-19 NOTE — Progress Notes (Signed)
   Subjective:    Patient ID: Ruben King, male    DOB: 09/11/1955, 59 y.o.   MRN: [id number]  HPI Patient has long-standing history of cervical and lumbar pain. He is seen with one-week history of occasional sharp pains radiating down right upper extremity to about the mid arm. No injury. Pain is moderate to severe at times. Occasional tingling sensation in his arm. Patient had MRI cervical spine November 2013 which showed multilevel degenerative disc problems. He has seen neurosurgeon in the past who did not recommend surgery. Has taken occasional hydrocodone which helps. He tries to avoid regular use.  Past Medical History  Diagnosis Date  . Anxiety   . Depression   . ADHD (attention deficit hyperactivity disorder)   . Sleep apnea   . Migraines    Past Surgical History  Procedure Laterality Date  . Tonsillectomy  1961  . Back surgery  2001  . Knee surgery Bilateral     X 3    reports that he quit smoking about 6 years ago. His smoking use included Cigarettes. He has a 2.5 pack-year smoking history. He does not have any smokeless tobacco history on file. His alcohol and drug histories are not on file. family history includes Arthritis in his mother; Cancer in his father; Diabetes in his maternal grandmother and mother; Heart disease in his maternal grandfather and maternal grandmother; Heart disease (age of onset: 6270) in his mother. Allergies  Allergen Reactions  . Penicillins     anaphylisis  . Sulfa Antibiotics     As a child      Review of Systems  Constitutional: Negative for fever and chills.  Respiratory: Negative for shortness of breath.   Cardiovascular: Negative for chest pain.  Neurological: Negative for weakness.  Hematological: Negative for adenopathy.       Objective:   Physical Exam  Constitutional: He appears well-developed and well-nourished.  Neck: Neck supple.  Cardiovascular: Normal rate and regular rhythm.   Pulmonary/Chest: Effort normal and  breath sounds normal. No respiratory distress. He has no wheezes. He has no rales.  Musculoskeletal: He exhibits no edema.  Minimally tender lower cervical spine region. He has fairly good range of motion cervical neck with flexion, extension, lateral bending, and rotation.  Lymphadenopathy:    He has no cervical adenopathy.  Neurological:  Full strength upper extremities. Symmetric upper extremity reflexes. No muscle atrophy.          Assessment & Plan:  Cervical neck pain with intermittent right cervical radiculopathy symptoms. Nonfocal exam. Prednisone taper. Refill hydrocodone for limited use as needed. Robaxin 500 mg every 8 hours as needed for muscle spasm. He will consider physical therapy if no improvement in 1-2 weeks

## 2014-12-19 NOTE — Progress Notes (Signed)
Pre visit review using our clinic review tool, if applicable. No additional management support is needed unless otherwise documented below in the visit note. 

## 2014-12-19 NOTE — Patient Instructions (Signed)

## 2014-12-20 ENCOUNTER — Ambulatory Visit: Payer: Self-pay | Admitting: Family Medicine

## 2015-02-15 ENCOUNTER — Other Ambulatory Visit: Payer: Self-pay | Admitting: Family Medicine

## 2015-03-11 ENCOUNTER — Encounter: Payer: Self-pay | Admitting: Family Medicine

## 2015-03-11 ENCOUNTER — Ambulatory Visit (INDEPENDENT_AMBULATORY_CARE_PROVIDER_SITE_OTHER): Payer: BLUE CROSS/BLUE SHIELD | Admitting: Family Medicine

## 2015-03-11 VITALS — BP 120/80 | HR 80 | Temp 98.9°F | Ht 70.5 in | Wt 250.0 lb

## 2015-03-11 DIAGNOSIS — R319 Hematuria, unspecified: Secondary | ICD-10-CM | POA: Diagnosis not present

## 2015-03-11 LAB — POCT URINALYSIS DIPSTICK
Bilirubin, UA: NEGATIVE
Blood, UA: NEGATIVE
Glucose, UA: NEGATIVE
KETONES UA: NEGATIVE
LEUKOCYTES UA: NEGATIVE
NITRITE UA: NEGATIVE
Spec Grav, UA: 1.02
UROBILINOGEN UA: 2
pH, UA: 8.5

## 2015-03-11 NOTE — Progress Notes (Signed)
   Subjective:    Patient ID: Ruben King, male    DOB: Jul 16, 1955, 59 y.o.   MRN: [id number]  HPI Here for several days of a reddish tint to his urine, making him think there was blood in the urine. No dysuria or pain at all. He had several bouts of kidney stones about 15 years ago, each treated with lithotripsy.    Review of Systems  Constitutional: Negative.   Gastrointestinal: Negative.   Genitourinary: Positive for hematuria. Negative for dysuria, urgency, frequency, flank pain, difficulty urinating and testicular pain.       Objective:   Physical Exam  Constitutional: He appears well-developed and well-nourished.  Cardiovascular: Normal rate, regular rhythm, normal heart sounds and intact distal pulses.   Pulmonary/Chest: Effort normal and breath sounds normal.  Abdominal: Soft. Bowel sounds are normal. He exhibits no distension and no mass. There is no tenderness. There is no rebound and no guarding.  Genitourinary: Rectum normal and prostate normal.          Assessment & Plan:  Transient hematuria. We will culture the urine sample. He will drink plenty of water. Refer to Urology here in [place]

## 2015-03-11 NOTE — Progress Notes (Signed)
Pre visit review using our clinic review tool, if applicable. No additional management support is needed unless otherwise documented below in the visit note. 

## 2015-03-13 LAB — URINE CULTURE: Colony Count: 25000

## 2015-05-11 ENCOUNTER — Other Ambulatory Visit: Payer: Self-pay | Admitting: Family Medicine

## 2015-06-26 ENCOUNTER — Encounter: Payer: Self-pay | Admitting: Family Medicine

## 2015-06-26 ENCOUNTER — Ambulatory Visit (INDEPENDENT_AMBULATORY_CARE_PROVIDER_SITE_OTHER): Payer: BLUE CROSS/BLUE SHIELD | Admitting: Family Medicine

## 2015-06-26 VITALS — BP 120/94 | HR 74 | Temp 98.6°F | Resp 14 | Ht 70.5 in | Wt 255.0 lb

## 2015-06-26 DIAGNOSIS — M549 Dorsalgia, unspecified: Secondary | ICD-10-CM

## 2015-06-26 DIAGNOSIS — G8929 Other chronic pain: Secondary | ICD-10-CM

## 2015-06-26 DIAGNOSIS — Z79899 Other long term (current) drug therapy: Secondary | ICD-10-CM | POA: Diagnosis not present

## 2015-06-26 MED ORDER — METHOCARBAMOL 500 MG PO TABS: 500.0000 mg | ORAL_TABLET | Freq: Three times a day (TID) | ORAL | Status: DC | PRN

## 2015-06-26 MED ORDER — DICLOFENAC SODIUM 75 MG PO TBEC: 75.0000 mg | DELAYED_RELEASE_TABLET | Freq: Two times a day (BID) | ORAL | Status: DC

## 2015-06-26 NOTE — Patient Instructions (Signed)
Consider complete physical at some point this year.  

## 2015-06-26 NOTE — Progress Notes (Signed)
   Subjective:    Patient ID: Ruben King, male    DOB: 10/09/1955, 59 y.o.   MRN: [id number]  HPI  Patient seen requesting medication refills Chronic lumbar back pain. He's had one prior surgery. He has seen rehabilitation specialist in the past. He takes diclofenac 75 mg twice daily which helps tremendously with his chronic pain. Takes as needed Robaxin. He has some chronic pain lower lumbar area -bilateral. No recent radiculopathy symptoms. Denies lower extremity numbness or weakness.  Requesting work modification no with no more than 8 hours work per day, limited walking, no lifting over 20 pounds, and no stairs. He has been kept on these restrictions in the past.  He has not had physical in several years  Past Medical History  Diagnosis Date  . Anxiety   . Depression   . ADHD (attention deficit hyperactivity disorder)   . Sleep apnea   . Migraines   . Kidney stones 2000   Past Surgical History  Procedure Laterality Date  . Tonsillectomy  1961  . Back surgery  2001  . Knee surgery Bilateral     X 3  . Lithotripsy  2000    [place]    reports that he quit smoking about 6 years ago. His smoking use included Cigarettes. He has a 2.5 pack-year smoking history. He does not have any smokeless tobacco history on file. His alcohol and drug histories are not on file. family history includes Arthritis in his mother; Cancer in his father; Diabetes in his maternal grandmother and mother; Heart disease in his maternal grandfather and maternal grandmother; Heart disease (age of onset: 5270) in his mother. Allergies  Allergen Reactions  . Penicillins     anaphylisis  . Sulfa Antibiotics     As a child     Review of Systems  Constitutional: Negative for fever, activity change and appetite change.  Respiratory: Negative for cough and shortness of breath.   Cardiovascular: Negative for chest pain and leg swelling.  Gastrointestinal: Negative for vomiting and  abdominal pain.  Genitourinary: Negative for dysuria, hematuria and flank pain.  Musculoskeletal: Positive for back pain. Negative for joint swelling.  Neurological: Negative for weakness and numbness.       Objective:   Physical Exam  Constitutional: He appears well-developed and well-nourished.  Cardiovascular: Normal rate and regular rhythm.   Pulmonary/Chest: Effort normal and breath sounds normal. No respiratory distress. He has no wheezes. He has no rales.  Musculoskeletal:  Straight leg raises are negative bilaterally  Neurological:  Full-strength lower extremities. Trace reflexes knee and ankle bilaterally. No sensory impairment          Assessment & Plan:  Chronic lumbar back pain. Refill diclofenac and Robaxin. Caution about chronic nonsteroidal use. Check hepatic panel with chronic diclofenac use. He plans to schedule complete physical and will check lab work then.

## 2015-06-27 ENCOUNTER — Telehealth: Payer: Self-pay | Admitting: Family Medicine

## 2015-06-27 NOTE — Telephone Encounter (Signed)
Mr. Ruben King called saying Dr. Caryl NeverBurchette is supposed to send his employer a letter with work restrictions listed. The name of the [employer] is [employer] and the fax # is: [phone]. If you have questions, please give him a call.   Pt's ph# [phone] Thank you.

## 2015-07-05 ENCOUNTER — Encounter: Payer: Self-pay | Admitting: Family Medicine

## 2015-07-05 ENCOUNTER — Other Ambulatory Visit (INDEPENDENT_AMBULATORY_CARE_PROVIDER_SITE_OTHER): Payer: BLUE CROSS/BLUE SHIELD

## 2015-07-05 DIAGNOSIS — Z0000 Encounter for general adult medical examination without abnormal findings: Secondary | ICD-10-CM

## 2015-07-05 LAB — CBC WITH DIFFERENTIAL/PLATELET
BASOS ABS: 0 10*3/uL (ref 0.0–0.1)
Basophils Relative: 0.5 % (ref 0.0–3.0)
EOS PCT: 6.1 % — AB (ref 0.0–5.0)
Eosinophils Absolute: 0.2 10*3/uL (ref 0.0–0.7)
HCT: 45.6 % (ref 39.0–52.0)
HEMOGLOBIN: 15.3 g/dL (ref 13.0–17.0)
LYMPHS ABS: 0.9 10*3/uL (ref 0.7–4.0)
Lymphocytes Relative: 25.2 % (ref 12.0–46.0)
MCHC: 33.6 g/dL (ref 30.0–36.0)
MCV: 92.8 fl (ref 78.0–100.0)
MONO ABS: 0.5 10*3/uL (ref 0.1–1.0)
MONOS PCT: 15.6 % — AB (ref 3.0–12.0)
NEUTROS PCT: 52.6 % (ref 43.0–77.0)
Neutro Abs: 1.8 10*3/uL (ref 1.4–7.7)
Platelets: 159 10*3/uL (ref 150.0–400.0)
RBC: 4.91 Mil/uL (ref 4.22–5.81)
RDW: 13.1 % (ref 11.5–15.5)
WBC: 3.5 10*3/uL — AB (ref 4.0–10.5)

## 2015-07-05 LAB — PSA: PSA: 0.35 ng/mL (ref 0.10–4.00)

## 2015-07-05 LAB — LIPID PANEL
CHOL/HDL RATIO: 3
Cholesterol: 182 mg/dL (ref 0–200)
HDL: 68.7 mg/dL (ref >39.00)
LDL CALC: 91 mg/dL (ref 0–99)
NONHDL: 113.19
TRIGLYCERIDES: 112 mg/dL (ref 0.0–149.0)
VLDL: 22.4 mg/dL (ref 0.0–40.0)

## 2015-07-05 LAB — HEPATIC FUNCTION PANEL
ALK PHOS: 53 U/L (ref 39–117)
ALT: 23 U/L (ref 0–53)
AST: 14 U/L (ref 0–37)
Albumin: 4.3 g/dL (ref 3.5–5.2)
BILIRUBIN TOTAL: 0.9 mg/dL (ref 0.2–1.2)
Bilirubin, Direct: 0.1 mg/dL (ref 0.0–0.3)
Total Protein: 6.8 g/dL (ref 6.0–8.3)

## 2015-07-05 LAB — BASIC METABOLIC PANEL
BUN: 18 mg/dL (ref 6–23)
CALCIUM: 9.2 mg/dL (ref 8.4–10.5)
CO2: 34 mEq/L — ABNORMAL HIGH (ref 19–32)
Chloride: 96 mEq/L (ref 96–112)
Creatinine, Ser: 0.87 mg/dL (ref 0.40–1.50)
GFR: 95.34 mL/min (ref >60.00)
GLUCOSE: 108 mg/dL — AB (ref 70–99)
POTASSIUM: 3.6 meq/L (ref 3.5–5.1)
Sodium: 141 mEq/L (ref 135–145)

## 2015-07-05 LAB — TSH: TSH: 3.51 u[IU]/mL (ref 0.35–4.50)

## 2015-07-05 NOTE — Telephone Encounter (Signed)
Called and spoke with pt and pt is aware letter will be faxed.

## 2015-07-05 NOTE — Telephone Encounter (Signed)
Letter done again.

## 2015-07-12 ENCOUNTER — Encounter: Payer: Self-pay | Admitting: Family Medicine

## 2015-07-12 ENCOUNTER — Ambulatory Visit (INDEPENDENT_AMBULATORY_CARE_PROVIDER_SITE_OTHER): Payer: BLUE CROSS/BLUE SHIELD | Admitting: Family Medicine

## 2015-07-12 VITALS — BP 90/70 | HR 96 | Temp 97.9°F | Resp 16 | Ht 70.0 in | Wt 252.8 lb

## 2015-07-12 DIAGNOSIS — Z23 Encounter for immunization: Secondary | ICD-10-CM | POA: Diagnosis not present

## 2015-07-12 DIAGNOSIS — R7303 Prediabetes: Secondary | ICD-10-CM | POA: Diagnosis not present

## 2015-07-12 DIAGNOSIS — Z0000 Encounter for general adult medical examination without abnormal findings: Secondary | ICD-10-CM | POA: Diagnosis not present

## 2015-07-12 NOTE — Progress Notes (Signed)
Pre visit review using our clinic review tool, if applicable. No additional management support is needed unless otherwise documented below in the visit note. 

## 2015-07-12 NOTE — Patient Instructions (Addendum)
Why follow it? Research shows. . Those who follow the Mediterranean diet have a reduced risk of heart disease  . The diet is associated with a reduced incidence of Parkinson's and Alzheimer's diseases . People following the diet may have longer life expectancies and lower rates of chronic diseases  . The Dietary Guidelines for Americans recommends the Mediterranean diet as an eating plan to promote health and prevent disease  What Is the Mediterranean Diet?  . Healthy eating plan based on typical foods and recipes of Mediterranean-style cooking . The diet is primarily a plant based diet; these foods should make up a majority of meals   Starches - Plant based foods should make up a majority of meals - They are an important sources of vitamins, minerals, energy, antioxidants, and fiber - Choose whole grains, foods high in fiber and minimally processed items  - Typical grain sources include wheat, oats, barley, corn, brown rice, bulgar, farro, millet, polenta, couscous  - Various types of beans include chickpeas, lentils, fava beans, black beans, white beans   Fruits  Veggies - Large quantities of antioxidant rich fruits & veggies; 6 or more servings  - Vegetables can be eaten raw or lightly drizzled with oil and cooked  - Vegetables common to the traditional Mediterranean Diet include: artichokes, arugula, beets, broccoli, brussel sprouts, cabbage, carrots, celery, collard greens, cucumbers, eggplant, kale, leeks, lemons, lettuce, mushrooms, okra, onions, peas, peppers, potatoes, pumpkin, radishes, rutabaga, shallots, spinach, sweet potatoes, turnips, zucchini - Fruits common to the Mediterranean Diet include: apples, apricots, avocados, cherries, clementines, dates, figs, grapefruits, grapes, melons, nectarines, oranges, peaches, pears, pomegranates, strawberries, tangerines  Fats - Replace butter and margarine with healthy oils, such as olive oil, canola oil, and tahini  - Limit nuts to no  more than a handful a day  - Nuts include walnuts, almonds, pecans, pistachios, pine nuts  - Limit or avoid candied, honey roasted or heavily salted nuts - Olives are central to the Mediterranean diet - can be eaten whole or used in a variety of dishes   Meats Protein - Limiting red meat: no more than a few times a month - When eating red meat: choose lean cuts and keep the portion to the size of deck of cards - Eggs: approx. 0 to 4 times a week  - Fish and lean poultry: at least 2 a week  - Healthy protein sources include, chicken, turkey, lean beef, lamb - Increase intake of seafood such as tuna, salmon, trout, mackerel, shrimp, scallops - Avoid or limit high fat processed meats such as sausage and bacon  Dairy - Include moderate amounts of low fat dairy products  - Focus on healthy dairy such as fat free yogurt, skim milk, low or reduced fat cheese - Limit dairy products higher in fat such as whole or 2% milk, cheese, ice cream  Alcohol - Moderate amounts of red wine is ok  - No more than 5 oz daily for women (all ages) and men older than age 65  - No more than 10 oz of wine daily for men younger than 65  Other - Limit sweets and other desserts  - Use herbs and spices instead of salt to flavor foods  - Herbs and spices common to the traditional Mediterranean Diet include: basil, bay leaves, chives, cloves, cumin, fennel, garlic, lavender, marjoram, mint, oregano, parsley, pepper, rosemary, sage, savory, sumac, tarragon, thyme   It's not just a diet, it's a lifestyle:  . The Mediterranean diet includes   lifestyle factors typical of those in the region  . Foods, drinks and meals are best eaten with others and savored . Daily physical activity is important for overall good health . This could be strenuous exercise like running and aerobics . This could also be more leisurely activities such as walking, housework, yard-work, or taking the stairs . Moderation is the key; a balanced and  healthy diet accommodates most foods and drinks . Consider portion sizes and frequency of consumption of certain foods   Meal Ideas & Options:  . Breakfast:  o Whole wheat toast or whole wheat English muffins with peanut butter & hard boiled egg o Steel cut oats topped with apples & cinnamon and skim milk  o Fresh fruit: banana, strawberries, melon, berries, peaches  o Smoothies: strawberries, bananas, greek yogurt, peanut butter o Low fat greek yogurt with blueberries and granola  o Egg white omelet with spinach and mushrooms o Breakfast couscous: whole wheat couscous, apricots, skim milk, cranberries  . Sandwiches:  o Hummus and grilled vegetables (peppers, zucchini, squash) on whole wheat bread   o Grilled chicken on whole wheat pita with lettuce, tomatoes, cucumbers or tzatziki  o Tuna salad on whole wheat bread: tuna salad made with greek yogurt, olives, red peppers, capers, green onions o Garlic rosemary lamb pita: lamb sauted with garlic, rosemary, salt & pepper; add lettuce, cucumber, greek yogurt to pita - flavor with lemon juice and black pepper  . Seafood:  o Mediterranean grilled salmon, seasoned with garlic, basil, parsley, lemon juice and black pepper o Shrimp, lemon, and spinach whole-grain pasta salad made with low fat greek yogurt  o Seared scallops with lemon orzo  o Seared tuna steaks seasoned salt, pepper, coriander topped with tomato mixture of olives, tomatoes, olive oil, minced garlic, parsley, green onions and cappers  . Meats:  o Herbed greek chicken salad with kalamata olives, cucumber, feta  o Red bell peppers stuffed with spinach, bulgur, lean ground beef (or lentils) & topped with feta   o Kebabs: skewers of chicken, tomatoes, onions, zucchini, squash  o [place]key burgers: made with red onions, mint, dill, lemon juice, feta cheese topped with roasted red peppers . Vegetarian o Cucumber salad: cucumbers, artichoke hearts, celery, red onion, feta cheese, tossed in  olive oil & lemon juice  o Hummus and whole grain pita points with a greek salad (lettuce, tomato, feta, olives, cucumbers, red onion) o Lentil soup with celery, carrots made with vegetable broth, garlic, salt and pepper  o Tabouli salad: parsley, bulgur, mint, scallions, cucumbers, tomato, radishes, lemon juice, olive oil, salt and pepper.  Try to lose some weight Get back to more consistent exercise.

## 2015-07-12 NOTE — Progress Notes (Signed)
Subjective:    Patient ID: Ruben King, male    DOB: 08/22/1955, 59 y.o.   MRN: [id number]  HPI Patient here for complete physical His chronic problems include chronic back pain, obesity, attention deficit disorder, chronic insomnia, migraine headaches, obstructive sleep apnea. He is followed by orthopedics and psychiatry Colonoscopy 2008. Last tetanus unknown. Already received flu vaccine. Nonsmoker. No consistent exercise. He is limited somewhat by orthopedic issues.  Past Medical History  Diagnosis Date  . Anxiety   . Depression   . ADHD (attention deficit hyperactivity disorder)   . Sleep apnea   . Migraines   . Kidney stones 2000   Past Surgical History  Procedure Laterality Date  . Tonsillectomy  1961  . Back surgery  2001  . Knee surgery Bilateral     X 3  . Lithotripsy  2000    [place]    reports that he quit smoking about 6 years ago. His smoking use included Cigarettes. He has a 2.5 pack-year smoking history. He does not have any smokeless tobacco history on file. His alcohol and drug histories are not on file. family history includes Arthritis in his mother; Cancer in his father; Diabetes in his maternal grandmother and mother; Heart disease in his maternal grandfather and maternal grandmother; Heart disease (age of onset: 6470) in his mother. Allergies  Allergen Reactions  . Penicillins     anaphylisis  . Sulfa Antibiotics     As a child      Review of Systems  Constitutional: Negative for fever, activity change, appetite change and fatigue.  HENT: Negative for congestion, ear pain and trouble swallowing.   Eyes: Negative for pain and visual disturbance.  Respiratory: Negative for cough, shortness of breath and wheezing.   Cardiovascular: Negative for chest pain and palpitations.  Gastrointestinal: Negative for nausea, vomiting, abdominal pain, diarrhea, constipation, blood in stool, abdominal distention and rectal pain.    Genitourinary: Negative for dysuria, hematuria and testicular pain.  Musculoskeletal: Positive for back pain and arthralgias. Negative for joint swelling.  Skin: Negative for rash.  Neurological: Negative for dizziness, syncope and headaches.  Hematological: Negative for adenopathy.  Psychiatric/Behavioral: Negative for confusion and dysphoric mood.       Objective:   Physical Exam  Constitutional: He is oriented to person, place, and time. He appears well-developed and well-nourished. No distress.  HENT:  Head: Normocephalic and atraumatic.  Right Ear: External ear normal.  Left Ear: External ear normal.  Mouth/Throat: Oropharynx is clear and moist.  Eyes: Conjunctivae and EOM are normal. Pupils are equal, round, and reactive to light.  Neck: Normal range of motion. Neck supple. No thyromegaly present.  Cardiovascular: Normal rate, regular rhythm and normal heart sounds.   No murmur heard. Pulmonary/Chest: No respiratory distress. He has no wheezes. He has no rales.  Abdominal: Soft. Bowel sounds are normal. He exhibits no distension and no mass. There is no tenderness. There is no rebound and no guarding.  Musculoskeletal: He exhibits no edema.  Lymphadenopathy:    He has no cervical adenopathy.  Neurological: He is alert and oriented to person, place, and time. He displays normal reflexes. No cranial nerve deficit.  Skin: No rash noted.  Psychiatric: He has a normal mood and affect.          Assessment & Plan:  Physical exam. Labs reviewed. He has blood sugar 108 reflecting prediabetes. We discussed lifestyle management. Restrict refined sugars and white starches. Lose some weight. Establish more  consistent exercise. Tetanus booster given. Colonoscopy repeat in 2 years. Suggest follow-up fasting blood sugar and A1c in 6 months.  Information given on Mediterranean diet

## 2015-10-04 ENCOUNTER — Ambulatory Visit (INDEPENDENT_AMBULATORY_CARE_PROVIDER_SITE_OTHER): Payer: BLUE CROSS/BLUE SHIELD | Admitting: Family Medicine

## 2015-10-04 ENCOUNTER — Encounter: Payer: Self-pay | Admitting: Family Medicine

## 2015-10-04 VITALS — BP 132/90 | HR 87 | Temp 98.5°F | Ht 70.0 in | Wt 246.7 lb

## 2015-10-04 DIAGNOSIS — M47812 Spondylosis without myelopathy or radiculopathy, cervical region: Secondary | ICD-10-CM

## 2015-10-04 DIAGNOSIS — M5412 Radiculopathy, cervical region: Secondary | ICD-10-CM

## 2015-10-04 MED ORDER — PREDNISONE 10 MG PO TABS: ORAL_TABLET | ORAL | Status: DC

## 2015-10-04 MED ORDER — HYDROCODONE-ACETAMINOPHEN 5-325 MG PO TABS: 1.0000 | ORAL_TABLET | Freq: Four times a day (QID) | ORAL | Status: DC | PRN

## 2015-10-04 NOTE — Progress Notes (Signed)
   Subjective:    Patient ID: Ruben AlpersCharles Bellard, male    DOB: 11/24/1955, 60 y.o.   MRN: [id number]  HPI  Patient has long history of back difficulties.  He is seen with couple weeks of some progressive right lower neck pain with radiation toward the right shoulder. He had similar problems in the past. Patient had MRI cervical spine November 2013 which showed multilevel spondylosis. His current pain is achy quality and severe at times. Does not radiate below the deltoid region. He denies any upper extending numbness. Question intermittent weakness. Currently taking diclofenac without much improvement. Has responded to prednisone in the past.  Past Medical History  Diagnosis Date  . Anxiety   . Depression   . ADHD (attention deficit hyperactivity disorder)   . Sleep apnea   . Migraines   . Kidney stones 2000   Past Surgical History  Procedure Laterality Date  . Tonsillectomy  1961  . Back surgery  2001  . Knee surgery Bilateral     X 3  . Lithotripsy  2000    [place]    reports that he quit smoking about 6 years ago. His smoking use included Cigarettes. He has a 2.5 pack-year smoking history. He does not have any smokeless tobacco history on file. His alcohol and drug histories are not on file. family history includes Arthritis in his mother; Cancer in his father; Diabetes in his maternal grandmother and mother; Heart disease in his maternal grandfather and maternal grandmother; Heart disease (age of onset: 5170) in his mother. Allergies  Allergen Reactions  . Penicillins     anaphylisis  . Sulfa Antibiotics     As a child      Review of Systems  Constitutional: Negative for fever and chills.  Respiratory: Negative for cough and shortness of breath.   Cardiovascular: Negative for chest pain.  Musculoskeletal: Positive for back pain and neck pain.       Objective:   Physical Exam  Constitutional: He appears well-developed and well-nourished.  Neck: Neck  supple.  Cardiovascular: Normal rate and regular rhythm.   Pulmonary/Chest: Effort normal and breath sounds normal. No respiratory distress. He has no wheezes. He has no rales.  Musculoskeletal:  Pain with lateral bending and rotation to either side. He has some mild tenderness around the base of the cervical spine region. Full range of motion right shoulder. No impingement findings.  Neurological:  Symmetric upper extremity reflexes. No muscle atrophy. No focal weakness. Normal sensory function throughout.          Assessment & Plan:  Right cervical radiculitis. History of known multilevel spondylosis. Non-focal exam neurologically. Prednisone taper. Hold diclofenac while taking prednisone. Refill hydrocodone 5 mg 1-2 every 6 hours for severe pain #30. Cautioned about sedation. Will recommend follow-up with his neurosurgeon if not improving over the next week

## 2015-10-04 NOTE — Progress Notes (Signed)
Pre visit review using our clinic review tool, if applicable. No additional management support is needed unless otherwise documented below in the visit note. 

## 2015-10-04 NOTE — Patient Instructions (Signed)

## 2015-10-30 ENCOUNTER — Ambulatory Visit (INDEPENDENT_AMBULATORY_CARE_PROVIDER_SITE_OTHER): Payer: BLUE CROSS/BLUE SHIELD | Admitting: Family Medicine

## 2015-10-30 ENCOUNTER — Encounter: Payer: Self-pay | Admitting: Family Medicine

## 2015-10-30 VITALS — BP 110/80 | HR 89 | Temp 98.7°F | Ht 70.0 in | Wt 247.0 lb

## 2015-10-30 DIAGNOSIS — Z01818 Encounter for other preprocedural examination: Secondary | ICD-10-CM

## 2015-10-30 NOTE — Progress Notes (Signed)
Pre visit review using our clinic review tool, if applicable. No additional management support is needed unless otherwise documented below in the visit note. 

## 2015-10-30 NOTE — Progress Notes (Signed)
   Subjective:    Patient ID: Ruben AlpersCharles Adamek, male    DOB: 06/04/1956, 60 y.o.   MRN: [id number]  HPI  Patient here to get presurgical clearance.  Osteoarthritis right knee. Looking at right total knee replacement.  He's had prior history of back surgery and knee surgery. No anesthesia problems.  Patient has no cardiac history. Very sedentary currently.  No recent chest pains. Mother had CABG in her 5070s.   Patient has had mildly elevated glucose in the prediabetes range. He has no history of hypertension or significant dyslipidemia. Quit smoking several years ago. No recent exertional symptoms.  Past Medical History  Diagnosis Date  . Anxiety   . Depression   . ADHD (attention deficit hyperactivity disorder)   . Sleep apnea   . Migraines   . Kidney stones 2000   Past Surgical History  Procedure Laterality Date  . Tonsillectomy  1961  . Back surgery  2001  . Knee surgery Bilateral     X 3  . Lithotripsy  2000    [place]    reports that he quit smoking about 7 years ago. His smoking use included Cigarettes. He has a 2.5 pack-year smoking history. He does not have any smokeless tobacco history on file. His alcohol and drug histories are not on file. family history includes Arthritis in his mother; Cancer in his father; Diabetes in his maternal grandmother and mother; Heart disease in his maternal grandfather and maternal grandmother; Heart disease (age of onset: 3670) in his mother. Allergies  Allergen Reactions  . Penicillins     anaphylisis  . Sulfa Antibiotics     As a child      Review of Systems  Constitutional: Negative for fatigue.  Eyes: Negative for visual disturbance.  Respiratory: Negative for cough, chest tightness and shortness of breath.   Cardiovascular: Negative for chest pain, palpitations and leg swelling.  Gastrointestinal: Negative for abdominal pain.  Endocrine: Negative for polydipsia and polyuria.  Musculoskeletal: Positive for  arthralgias.  Neurological: Negative for dizziness, syncope, weakness, light-headedness and headaches.       Objective:   Physical Exam  Constitutional: He is oriented to person, place, and time. He appears well-developed and well-nourished.  HENT:  Right Ear: External ear normal.  Left Ear: External ear normal.  Mouth/Throat: Oropharynx is clear and moist.  Eyes: Pupils are equal, round, and reactive to light.  Neck: Neck supple. No thyromegaly present.  No carotid bruits  Cardiovascular: Normal rate and regular rhythm.   Pulmonary/Chest: Effort normal and breath sounds normal. No respiratory distress. He has no wheezes. He has no rales.  Musculoskeletal: He exhibits no edema.  Neurological: He is alert and oriented to person, place, and time.          Assessment & Plan:   Presurgical clearance. No concerning symptoms. Check EKG. If normal, no further evaluation.  EKG sinus rhythm with RBBB.  No acute concerns.

## 2015-12-30 DIAGNOSIS — M1711 Unilateral primary osteoarthritis, right knee: Secondary | ICD-10-CM | POA: Insufficient documentation

## 2015-12-31 DIAGNOSIS — Z96651 Presence of right artificial knee joint: Secondary | ICD-10-CM | POA: Insufficient documentation

## 2016-06-22 ENCOUNTER — Ambulatory Visit (INDEPENDENT_AMBULATORY_CARE_PROVIDER_SITE_OTHER): Payer: BLUE CROSS/BLUE SHIELD | Admitting: Family Medicine

## 2016-06-22 ENCOUNTER — Encounter: Payer: Self-pay | Admitting: Family Medicine

## 2016-06-22 DIAGNOSIS — M47812 Spondylosis without myelopathy or radiculopathy, cervical region: Secondary | ICD-10-CM | POA: Insufficient documentation

## 2016-06-22 NOTE — Progress Notes (Signed)
Pre visit review using our clinic review tool, if applicable. No additional management support is needed unless otherwise documented below in the visit note. 

## 2016-06-22 NOTE — Progress Notes (Signed)
Subjective:     Patient ID: Ruben King, male   DOB: 04/20/1956, 60 y.o.   MRN: [id number]  HPI Patient here basically requesting some work restrictions. He has a long history of osteoarthritis involving mostly the cervical spine but also low back and he also had right total knee replacement several months ago. His current job requires stairs and frequent ladder climbing.   Also has had problems previously with carrying weights over 20 pounds. He is requesting restrictions regarding these issues. He is still followed by neurosurgery regarding his chronic degenerative spondylosis of the neck  Past Medical History:  Diagnosis Date  . ADHD (attention deficit hyperactivity disorder)   . Anxiety   . Depression   . Kidney stones 2000  . Migraines   . Sleep apnea    Past Surgical History:  Procedure Laterality Date  . BACK SURGERY  2001  . KNEE SURGERY Bilateral    X 3  . LITHOTRIPSY  2000   [place]  . TONSILLECTOMY  1961    reports that he quit smoking about 7 years ago. His smoking use included Cigarettes. He has a 2.50 pack-year smoking history. He has never used smokeless tobacco. He reports that he does not drink alcohol or use drugs. family history includes Arthritis in his mother; Cancer in his father; Diabetes in his maternal grandmother and mother; Heart disease in his maternal grandfather and maternal grandmother; Heart disease (age of onset: 8570) in his mother. Allergies  Allergen Reactions  . Penicillins     anaphylisis  . Sulfa Antibiotics     As a child     Review of Systems  Respiratory: Negative for shortness of breath.   Cardiovascular: Negative for chest pain.  Neurological: Negative for weakness and numbness.       Objective:   Physical Exam  Constitutional: He appears well-developed and well-nourished.  Cardiovascular: Normal rate and regular rhythm.   Pulmonary/Chest: Effort normal and breath sounds normal. No respiratory distress. He  has no wheezes. He has no rales.  Musculoskeletal:  He has some mild swelling of the right knee but no erythema. 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   Kristian CoveyBruce W Burchette MD [facility]

## 2016-09-14 ENCOUNTER — Encounter: Payer: Self-pay | Admitting: Family Medicine

## 2016-09-14 ENCOUNTER — Encounter: Payer: Self-pay | Admitting: *Deleted

## 2016-09-14 ENCOUNTER — Ambulatory Visit (INDEPENDENT_AMBULATORY_CARE_PROVIDER_SITE_OTHER): Payer: BLUE CROSS/BLUE SHIELD | Admitting: Family Medicine

## 2016-09-14 VITALS — BP 110/78 | HR 77 | Temp 98.4°F | Wt 260.0 lb

## 2016-09-14 DIAGNOSIS — M545 Low back pain, unspecified: Secondary | ICD-10-CM

## 2016-09-14 MED ORDER — METHOCARBAMOL 500 MG PO TABS: 500.0000 mg | ORAL_TABLET | Freq: Three times a day (TID) | ORAL | 1 refills | Status: DC | PRN

## 2016-09-14 MED ORDER — HYDROCODONE-ACETAMINOPHEN 5-325 MG PO TABS: 1.0000 | ORAL_TABLET | Freq: Four times a day (QID) | ORAL | 0 refills | Status: DC | PRN

## 2016-09-14 NOTE — Progress Notes (Signed)
Pre visit review using our clinic review tool, if applicable. No additional management support is needed unless otherwise documented below in the visit note. 

## 2016-09-14 NOTE — Patient Instructions (Signed)
Follow up for any progressive left lower extremity numbness or weakness.

## 2016-09-14 NOTE — Progress Notes (Signed)
Subjective:     Patient ID: Ruben King, male   DOB: 12/26/1955, 61 y.o.   MRN: [id number]  HPI Patient seen with left lower lumbar back pain radiating toward the left hip. Onset last Wednesday. His job is fairly physical but he denies any specific injury. He's had previous lumbar laminectomy L4-L5 several years ago. Current pain is consistently left-sided. He describes this as sharp, dull, and occasionally achy. Worse with walking. Worse with changing positions. No weakness. No numbness. No urine or stool incontinence. 10 out 10 severity at it's worst. He started some physical therapy earlier today. He had some leftover Voltaren which did not help much.  Past Medical History:  Diagnosis Date  . ADHD (attention deficit hyperactivity disorder)   . Anxiety   . Depression   . Kidney stones 2000  . Migraines   . Sleep apnea    Past Surgical History:  Procedure Laterality Date  . BACK SURGERY  2001  . KNEE SURGERY Bilateral    X 3  . LITHOTRIPSY  2000   [place] in [place]  . TONSILLECTOMY  1961    reports that he quit smoking about 7 years ago. His smoking use included Cigarettes. He has a 2.50 pack-year smoking history. He has never used smokeless tobacco. He reports that he does not drink alcohol or use drugs. family history includes Arthritis in his mother; Cancer in his father; Diabetes in his maternal grandmother and mother; Heart disease in his maternal grandfather and maternal grandmother; Heart disease (age of onset: 1570) in his mother. Allergies  Allergen Reactions  . Penicillins     anaphylisis  . Sulfa Antibiotics     As a child     Review of Systems  Constitutional: Negative for chills and fever.  Gastrointestinal: Negative for abdominal pain.  Genitourinary: Negative for dysuria.  Musculoskeletal: Positive for back pain.  Neurological: Negative for weakness and numbness.  Hematological: Negative for adenopathy.       Objective:   Physical Exam   Constitutional: He appears well-developed and well-nourished.  Cardiovascular: Normal rate and regular rhythm.   Pulmonary/Chest: Effort normal and breath sounds normal. No respiratory distress. He has no wheezes. He has no rales.  Musculoskeletal: He exhibits no edema.  Straight leg raise is negative on the left. He has some mild tenderness to palpation left lower lumbar region.  Neurological:  Trace reflexes knee and ankle bilaterally. Full-strength throughout left lower extremity  Skin: No rash noted.       Assessment:     Left lumbar back pain. He has some pain radiating toward the hip but no focal weakness.    Plan:     -Continue diclofenac 1 twice daily with food -Refill Robaxin for as needed use -May supplement with hydrocodone 5/325 mg 1-2 every 6 hours for severe pain #30 with no refill -Work note written from today through 09/17/16 -Continue physical therapy -Follow-up immediately for any numbness or weakness or worsening pain  Kristian CoveyBruce W Leveta Wahab MD [facility]

## 2016-09-18 ENCOUNTER — Telehealth: Payer: Self-pay | Admitting: Family Medicine

## 2016-09-18 NOTE — Telephone Encounter (Signed)
Pt was to let Dr Caryl NeverBurchette know how is is doing. Pt states he has left leg weakness,and severe back pain today.

## 2016-09-20 NOTE — Telephone Encounter (Signed)
He has been followed by Dr Wynetta Emeryram (neurosurgeon) in the past and I recommend follow up with him if pain not improved.

## 2016-09-21 NOTE — Telephone Encounter (Signed)
Patient is aware 

## 2016-09-22 ENCOUNTER — Telehealth: Payer: Self-pay | Admitting: Family Medicine

## 2016-09-22 DIAGNOSIS — R29898 Other symptoms and signs involving the musculoskeletal system: Secondary | ICD-10-CM

## 2016-09-22 DIAGNOSIS — M545 Low back pain: Secondary | ICD-10-CM

## 2016-09-22 NOTE — Telephone Encounter (Signed)
° ° °  Pt said Dr Wynetta Emeryram advised him to contact his PCP to order a MRI so that when he comes to see him on 09/29/16 he will have the results. They are also requesting any office notes. Pt said Dr Caryl NeverBurchette is aware of his issue

## 2016-09-22 NOTE — Telephone Encounter (Signed)
MRI lumbar spine without contrast    

## 2016-09-22 NOTE — Telephone Encounter (Signed)
Okay to order?

## 2016-09-23 NOTE — Telephone Encounter (Signed)
Patient is aware that orders have been placed.

## 2016-09-23 NOTE — Telephone Encounter (Signed)
Orders placed.

## 2016-09-27 ENCOUNTER — Ambulatory Visit
Admission: RE | Admit: 2016-09-27 | Discharge: 2016-09-27 | Disposition: A | Discharge status: Home / Self Care | Payer: BLUE CROSS/BLUE SHIELD | Source: Ambulatory Visit | Attending: Family Medicine | Admitting: Family Medicine

## 2016-09-27 DIAGNOSIS — R29898 Other symptoms and signs involving the musculoskeletal system: Secondary | ICD-10-CM

## 2016-09-27 DIAGNOSIS — M545 Low back pain: Secondary | ICD-10-CM

## 2016-12-29 ENCOUNTER — Ambulatory Visit: Payer: BLUE CROSS/BLUE SHIELD | Admitting: Family Medicine

## 2016-12-30 ENCOUNTER — Ambulatory Visit (INDEPENDENT_AMBULATORY_CARE_PROVIDER_SITE_OTHER): Payer: BLUE CROSS/BLUE SHIELD | Admitting: Family Medicine

## 2016-12-30 ENCOUNTER — Encounter: Payer: Self-pay | Admitting: Family Medicine

## 2016-12-30 VITALS — BP 118/74 | HR 100 | Temp 98.4°F | Wt 261.8 lb

## 2016-12-30 DIAGNOSIS — H66002 Acute suppurative otitis media without spontaneous rupture of ear drum, left ear: Secondary | ICD-10-CM

## 2016-12-30 DIAGNOSIS — H60502 Unspecified acute noninfective otitis externa, left ear: Secondary | ICD-10-CM

## 2016-12-30 MED ORDER — CIPROFLOXACIN-HYDROCORTISONE 0.2-1 % OT SUSP: 3.0000 [drp] | Freq: Two times a day (BID) | OTIC | 0 refills | Status: DC

## 2016-12-30 MED ORDER — DICLOFENAC SODIUM 75 MG PO TBEC: 75.0000 mg | DELAYED_RELEASE_TABLET | Freq: Two times a day (BID) | ORAL | 1 refills | Status: DC

## 2016-12-30 MED ORDER — METHOCARBAMOL 500 MG PO TABS: 500.0000 mg | ORAL_TABLET | Freq: Three times a day (TID) | ORAL | 1 refills | Status: DC | PRN

## 2016-12-30 MED ORDER — DOXYCYCLINE HYCLATE 100 MG PO CAPS: 100.0000 mg | ORAL_CAPSULE | Freq: Two times a day (BID) | ORAL | 0 refills | Status: DC

## 2016-12-30 NOTE — Patient Instructions (Signed)

## 2016-12-30 NOTE — Progress Notes (Signed)
Subjective:     Patient ID: Ruben King, male   DOB: 05/18/1956, 61 y.o.   MRN: [id number]  HPI Patient presents left ear fullness and some itching in the canal over the past few days. Occasionally feels "off balance". No hearing loss. No fever. No chills. Occasional drainage from the canal. No recent trauma. No nasal congestion.  Past Medical History:  Diagnosis Date  . ADHD (attention deficit hyperactivity disorder)   . Anxiety   . Depression   . Kidney stones 2000  . Migraines   . Sleep apnea    Past Surgical History:  Procedure Laterality Date  . BACK SURGERY  2001  . KNEE SURGERY Bilateral    X 3  . LITHOTRIPSY  2000   [place] in [place]  . TONSILLECTOMY  1961    reports that he quit smoking about 8 years ago. His smoking use included Cigarettes. He has a 2.50 pack-year smoking history. He has never used smokeless tobacco. He reports that he does not drink alcohol or use drugs. family history includes Arthritis in his mother; Cancer in his father; Diabetes in his maternal grandmother and mother; Heart disease in his maternal grandfather and maternal grandmother; Heart disease (age of onset: 2670) in his mother. Allergies  Allergen Reactions  . Penicillins     anaphylisis  . Sulfa Antibiotics     As a child     Review of Systems  Constitutional: Negative for chills and fever.  HENT: Negative for hearing loss.   Neurological: Negative for headaches.       Objective:   Physical Exam  HENT:  Right Ear: External ear normal.  Mouth/Throat: Oropharynx is clear and moist.  Left ear canal reveals mild erythema. He has some supperative exudate. Portion of eardrum is visualized and erythematous. Partly obscured by puslike drainage. No obvious perforation seen.  Neck: Neck supple.  Lymphadenopathy:    He has no cervical adenopathy.       Assessment:     Probable supperative left otitis with changes consistent with otitis externa as well    Plan:     -Keep  water out of ears as much as possible -Cipro HC drops 3 drops left ear twice a day -Doxycycline 100 mg twice a day for 10 days. He is penicillin allergic -Reassess in 2 weeks if not better  Kristian CoveyBruce W Burchette MD [facility]

## 2017-01-28 ENCOUNTER — Other Ambulatory Visit: Payer: Self-pay | Admitting: Neurosurgery

## 2017-01-28 DIAGNOSIS — M5126 Other intervertebral disc displacement, lumbar region: Secondary | ICD-10-CM

## 2017-02-08 ENCOUNTER — Ambulatory Visit
Admission: RE | Admit: 2017-02-08 | Discharge: 2017-02-08 | Disposition: A | Discharge status: Home / Self Care | Payer: BLUE CROSS/BLUE SHIELD | Source: Ambulatory Visit | Attending: Neurosurgery | Admitting: Neurosurgery

## 2017-02-08 DIAGNOSIS — M5126 Other intervertebral disc displacement, lumbar region: Secondary | ICD-10-CM

## 2017-02-08 MED ORDER — METHYLPREDNISOLONE ACETATE 40 MG/ML INJ SUSP (RADIOLOG
120.0000 mg | Freq: Once | INTRAMUSCULAR | Status: AC
  Administered 2017-02-08: 120 mg via EPIDURAL

## 2017-02-08 MED ORDER — IOPAMIDOL (ISOVUE-M 200) INJECTION 41%
1.0000 mL | Freq: Once | INTRAMUSCULAR | Status: AC
  Administered 2017-02-08: 1 mL via EPIDURAL

## 2017-02-08 NOTE — Discharge Instructions (Signed)

## 2017-02-23 ENCOUNTER — Other Ambulatory Visit: Payer: Self-pay | Admitting: Family Medicine

## 2017-02-26 ENCOUNTER — Other Ambulatory Visit: Payer: Self-pay | Admitting: Neurosurgery

## 2017-02-26 DIAGNOSIS — M5126 Other intervertebral disc displacement, lumbar region: Secondary | ICD-10-CM

## 2017-03-18 ENCOUNTER — Other Ambulatory Visit: Payer: Self-pay

## 2017-03-19 ENCOUNTER — Ambulatory Visit
Admission: RE | Admit: 2017-03-19 | Discharge: 2017-03-19 | Disposition: A | Discharge status: Home / Self Care | Payer: BLUE CROSS/BLUE SHIELD | Source: Ambulatory Visit | Attending: Neurosurgery | Admitting: Neurosurgery

## 2017-03-19 DIAGNOSIS — M5126 Other intervertebral disc displacement, lumbar region: Secondary | ICD-10-CM

## 2017-03-19 MED ORDER — METHYLPREDNISOLONE ACETATE 40 MG/ML INJ SUSP (RADIOLOG
120.0000 mg | Freq: Once | INTRAMUSCULAR | Status: AC
  Administered 2017-03-19: 120 mg via EPIDURAL

## 2017-03-19 MED ORDER — IOPAMIDOL (ISOVUE-M 200) INJECTION 41%
1.0000 mL | Freq: Once | INTRAMUSCULAR | Status: AC
  Administered 2017-03-19: 1 mL via EPIDURAL

## 2017-03-19 NOTE — Discharge Instructions (Signed)

## 2017-05-06 ENCOUNTER — Other Ambulatory Visit: Payer: Self-pay | Admitting: Family Medicine

## 2017-07-03 ENCOUNTER — Other Ambulatory Visit: Payer: Self-pay | Admitting: Family Medicine

## 2017-07-14 ENCOUNTER — Ambulatory Visit: Payer: BLUE CROSS/BLUE SHIELD | Admitting: Family Medicine

## 2017-07-14 ENCOUNTER — Encounter: Payer: Self-pay | Admitting: Family Medicine

## 2017-07-14 VITALS — BP 104/68 | HR 68 | Temp 98.3°F | Wt 266.7 lb

## 2017-07-14 DIAGNOSIS — E785 Hyperlipidemia, unspecified: Secondary | ICD-10-CM

## 2017-07-14 DIAGNOSIS — Z1211 Encounter for screening for malignant neoplasm of colon: Secondary | ICD-10-CM

## 2017-07-14 DIAGNOSIS — M47812 Spondylosis without myelopathy or radiculopathy, cervical region: Secondary | ICD-10-CM

## 2017-07-14 NOTE — Progress Notes (Signed)
Subjective:     Patient ID: Ruben AlpersCharles Crutchley, male   DOB: 04/24/1956, 62 y.o.   MRN: [id number]  HPI Patient is here to discuss several items  Recent labs through work and these were reviewed. He had high triglycerides and minimally elevated cholesterol. Triglycerides 162. No family history of premature CAD. He recently obtained recumbent bike and hopes to start exercise more consistently seen.  Long history of back difficulties. He had recent epidural 2 which has helped considerably. He is requesting letter for work restrictions to avoid any lifting or climbing or frequent bending.  Patient had colonoscopy over 10 years ago. Needs follow-up. No recent change in stool habits.  Past Medical History:  Diagnosis Date  . ADHD (attention deficit hyperactivity disorder)   . Anxiety   . Depression   . Kidney stones 2000  . Migraines   . Sleep apnea    Past Surgical History:  Procedure Laterality Date  . BACK SURGERY  2001  . KNEE SURGERY Bilateral    X 3  . LITHOTRIPSY  2000   [place]  . TONSILLECTOMY  1961    reports that he quit smoking about 8 years ago. His smoking use included cigarettes. He has a 2.50 pack-year smoking history. he has never used smokeless tobacco. He reports that he does not drink alcohol or use drugs. family history includes Arthritis in his mother; Cancer in his father; Diabetes in his maternal grandmother and mother; Heart disease in his maternal grandfather and maternal grandmother; Heart disease (age of onset: 4870) in his mother. Allergies  Allergen Reactions  . Penicillins Anaphylaxis  . Sulfa Antibiotics     As a child     Review of Systems  Constitutional: Negative for fatigue and unexpected weight change.  Eyes: Negative for visual disturbance.  Respiratory: Negative for cough, chest tightness and shortness of breath.   Cardiovascular: Negative for chest pain, palpitations and leg swelling.  Endocrine: Negative for polydipsia and  polyuria.  Musculoskeletal: Positive for back pain.  Neurological: Negative for dizziness, syncope, weakness, light-headedness and headaches.       Objective:   Physical Exam  Constitutional: He is oriented to person, place, and time. He appears well-developed and well-nourished.  HENT:  Right Ear: External ear normal.  Left Ear: External ear normal.  Mouth/Throat: Oropharynx is clear and moist.  Eyes: Pupils are equal, round, and reactive to light.  Neck: Neck supple. No thyromegaly present.  Cardiovascular: Normal rate and regular rhythm.  Pulmonary/Chest: Effort normal and breath sounds normal. No respiratory distress. He has no wheezes. He has no rales.  Musculoskeletal: He exhibits no edema.  Neurological: He is alert and oriented to person, place, and time.       Assessment:     #1 history of chronic back pain. Improved following recent epidural injections  #2 hypertriglyceridemia and mild hyperlipidemia  #3 patient in need of repeat colon cancer screening    Plan:     -Set up referral to GI for repeat colonoscopy -Discuss dietary and lifestyle management of high triglycerides. We've recommend weight loss. Reduce sugars and starches.  Reviewed good natural sources of omega 3.   -Establish more consistent exercise -letter produced with restrictions regarding work-no lifting > 20 pounds, no climbing on ladders, no repetitive squatting or bending at waist.  Kristian CoveyBruce W Liliya Fullenwider MD [facility]

## 2017-07-14 NOTE — Patient Instructions (Signed)

## 2017-07-15 ENCOUNTER — Encounter: Payer: Self-pay | Admitting: Internal Medicine

## 2017-08-13 ENCOUNTER — Ambulatory Visit (AMBULATORY_SURGERY_CENTER): Payer: Self-pay | Admitting: *Deleted

## 2017-08-13 ENCOUNTER — Encounter: Payer: Self-pay | Admitting: Internal Medicine

## 2017-08-13 ENCOUNTER — Other Ambulatory Visit: Payer: Self-pay

## 2017-08-13 VITALS — Ht 70.0 in | Wt 266.0 lb

## 2017-08-13 DIAGNOSIS — Z1211 Encounter for screening for malignant neoplasm of colon: Secondary | ICD-10-CM

## 2017-08-13 MED ORDER — PEG-KCL-NACL-NASULF-NA ASC-C 140 G PO SOLR: 1.0000 | ORAL | 0 refills | Status: DC

## 2017-08-13 NOTE — Progress Notes (Signed)
No egg or soy allergy known to patient  No issues with past sedation with any surgeries  or procedures, no intubation problems  No diet pills per patient No home 02 use per patient  No blood thinners per patient  Pt denies issues with constipation  No A fib or A flutter  EMMI video sent to pt's e mail  Suprep, plenvu and golytely not covered by pt's insurance- gave sample plenvu  Medication Samples have been provided to the patient.  Drug name: plenvu            Qty: 1 kit  LOT: [id number]  Exp.Date: 12/2018  Dosing instructions: as directed   The patient has been instructed regarding the correct time, dose, and frequency of taking this medication, including desired effects and most common side effects.   Ruben King 3:26 PM 08/13/2017

## 2017-08-27 ENCOUNTER — Other Ambulatory Visit: Payer: Self-pay

## 2017-08-27 ENCOUNTER — Encounter: Payer: Self-pay | Admitting: Internal Medicine

## 2017-08-27 ENCOUNTER — Ambulatory Visit (AMBULATORY_SURGERY_CENTER): Payer: BLUE CROSS/BLUE SHIELD | Admitting: Internal Medicine

## 2017-08-27 VITALS — BP 116/80 | HR 67 | Temp 98.6°F | Resp 20 | Ht 70.0 in | Wt 266.0 lb

## 2017-08-27 DIAGNOSIS — Z1211 Encounter for screening for malignant neoplasm of colon: Secondary | ICD-10-CM

## 2017-08-27 DIAGNOSIS — Z1212 Encounter for screening for malignant neoplasm of rectum: Secondary | ICD-10-CM | POA: Diagnosis not present

## 2017-08-27 MED ORDER — SODIUM CHLORIDE 0.9 % IV SOLN: 500.0000 mL | Freq: Once | INTRAVENOUS | Status: AC

## 2017-08-27 NOTE — Patient Instructions (Signed)
HANDOUTS GIVEN FOR DIVERTICULOSIS AND HEMORRHOIDS  YOU HAD AN ENDOSCOPIC PROCEDURE TODAY AT THE [facility]:   Refer to the procedure report that was given to you for any specific questions about what was found during the examination.  If the procedure report does not answer your questions, please call your gastroenterologist to clarify.  If you requested that your care partner not be given the details of your procedure findings, then the procedure report has been included in a sealed envelope for you to review at your convenience later.  YOU SHOULD EXPECT: Some feelings of bloating in the abdomen. Passage of more gas than usual.  Walking can help get rid of the air that was put into your GI tract during the procedure and reduce the bloating. If you had a lower endoscopy (such as a colonoscopy or flexible sigmoidoscopy) you may notice spotting of blood in your stool or on the toilet paper. If you underwent a bowel prep for your procedure, you may not have a normal bowel movement for a few days.  Please Note:  You might notice some irritation and congestion in your nose or some drainage.  This is from the oxygen used during your procedure.  There is no need for concern and it should clear up in a day or so.  SYMPTOMS TO REPORT IMMEDIATELY:   Following lower endoscopy (colonoscopy or flexible sigmoidoscopy):  Excessive amounts of blood in the stool  Significant tenderness or worsening of abdominal pains  Swelling of the abdomen that is new, acute  Fever of 100F or higher  For urgent or emergent issues, a gastroenterologist can be reached at any hour by calling [phone].   DIET:  We do recommend a small meal at first, but then you may proceed to your regular diet.  Drink plenty of fluids but you should avoid alcoholic beverages for 24 hours.  ACTIVITY:  You should plan to take it easy for the rest of today and you should NOT DRIVE or use heavy machinery until tomorrow (because  of the sedation medicines used during the test).    FOLLOW UP: Our staff will call the number listed on your records the next business day following your procedure to check on you and address any questions or concerns that you may have regarding the information given to you following your procedure. If we do not reach you, we will leave a message.  However, if you are feeling well and you are not experiencing any problems, there is no need to return our call.  We will assume that you have returned to your regular daily activities without incident.  If any biopsies were taken you will be contacted by phone or by letter within the next 1-3 weeks.  Please call us at [phone] if you have not heard about the biopsies in 3 weeks.    SIGNATURES/CONFIDENTIALITY: You and/or your care partner have signed paperwork which will be entered into your electronic medical record.  These signatures attest to the fact that that the information above on your After Visit Summary has been reviewed and is understood.  Full responsibility of the confidentiality of this discharge information lies with you and/or your care-partner. 

## 2017-08-27 NOTE — Op Note (Signed)
[facility] Patient Name: Ruben King Procedure Date: 08/27/2017 10:29 AM MRN: [id number] Endoscopist: Beverley Fiedler , MD Age: 62 Referring MD:  Date of Birth: [birth year] Gender: Male Account #: [id number] Procedure:                Colonoscopy Indications:              Screening for colorectal malignant neoplasm, Last                            colonoscopy greater than 10 years ago Medicines:                Monitored Anesthesia Care Procedure:                Pre-Anesthesia Assessment:                           - Prior to the procedure, a History and Physical                            was performed, and patient medications and                            allergies were reviewed. The patient's tolerance of                            previous anesthesia was also reviewed. The risks                            and benefits of the procedure and the sedation                            options and risks were discussed with the patient.                            All questions were answered, and informed consent                            was obtained. Prior Anticoagulants: The patient has                            taken no previous anticoagulant or antiplatelet                            agents. ASA Grade Assessment: II - A patient with                            mild systemic disease. After reviewing the risks                            and benefits, the patient was deemed in                            satisfactory condition to undergo the procedure.  After obtaining informed consent, the colonoscope                            was passed under direct vision. Throughout the                            procedure, the patient's blood pressure, pulse, and                            oxygen saturations were monitored continuously. The                            Colonoscope was introduced through the anus and                            advanced to the the cecum,  identified by                            appendiceal orifice and ileocecal valve. The                            colonoscopy was performed without difficulty. The                            patient tolerated the procedure well. The quality                            of the bowel preparation was good. The ileocecal                            valve, appendiceal orifice, and rectum were                            photographed. Scope In: 10:36:14 AM Scope Out: 10:49:42 AM Scope Withdrawal Time: 0 hours 9 minutes 27 seconds  Total Procedure Duration: 0 hours 13 minutes 28 seconds  Findings:                 The digital rectal exam was normal.                           Multiple small-mouthed diverticula were found in                            the sigmoid colon.                           Internal hemorrhoids were found during                            retroflexion. The hemorrhoids were small and                            medium-sized.  The exam was otherwise without abnormality. Complications:            No immediate complications. Estimated Blood Loss:     Estimated blood loss: none. Impression:               - Diverticulosis in the sigmoid colon.                           - Internal hemorrhoids.                           - The examination was otherwise normal.                           - No specimens collected. Recommendation:           - Patient has a contact number available for                            emergencies. The signs and symptoms of potential                            delayed complications were discussed with the                            patient. Return to normal activities tomorrow.                            Written discharge instructions were provided to the                            patient.                           - Resume previous diet.                           - Continue present medications.                           - Repeat  colonoscopy in 10 years for screening                            purposes. Beverley FiedlerJay M Valree Feild, MD 08/27/2017 10:52:04 AM This report has been signed electronically.

## 2017-08-27 NOTE — Progress Notes (Signed)
Pt's states no medical or surgical changes since previsit or office visit. 

## 2017-08-30 ENCOUNTER — Telehealth: Payer: Self-pay

## 2017-08-30 ENCOUNTER — Other Ambulatory Visit: Payer: Self-pay | Admitting: Family Medicine

## 2017-08-30 ENCOUNTER — Telehealth: Payer: Self-pay | Admitting: *Deleted

## 2017-08-30 NOTE — Telephone Encounter (Signed)
Called [phone] and left a messaged we tried to reach pt for a follow up call. maw

## 2017-08-30 NOTE — Telephone Encounter (Signed)
LEft message for post procedure call back will attempt to call back later this afternoon. SM

## 2017-10-18 ENCOUNTER — Ambulatory Visit: Payer: Self-pay

## 2017-10-18 NOTE — Telephone Encounter (Signed)
Phone call from pt.  C/o onset of numbness and tingling in right neck and shoulder.  C/o intermittent shooting pain from neck to shoulder and upper right arm. Denied any weakness or numbness of right forearm/hand or right lower extremity. Denied problem with mobility of right arm and shoulder.  Reported had similar sx's about 5 years ago.  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  Reported he feels that his symptoms are related to his work.  Has been taking Dicofenac for pain.  Also stated he has used a heating pad intermittently.  Appt. Given for 10/19/17.  Care advice per protocol.  Verb. Understanding. Agrees with plan.                Reason for Disposition . [1] MODERATE neck pain (e.g., interferes with normal activities AND [2] present > 3 days  Answer Assessment - Initial Assessment Questions 1. ONSET: "When did the pain begin?"      Right neck pain with numbnes and tingling in right neck and right shoulder 2. LOCATION: "Where does it hurt?"      Anterior and posterior neck and shoulder 3. PATTERN "Does the pain come and go, or has it been constant since it started?"      constant 4. SEVERITY: "How bad is the pain?"  (Scale 1-10; or mild, moderate, severe)   - MILD (1-3): doesn't interfere with normal activities    - MODERATE (4-7): interferes with normal activities or awakens from sleep    - SEVERE (8-10):  excruciating pain, unable to do any normal activities      Moderate until shooting pain occurs, then becomes worse for quick interval   5. RADIATION: "Does the pain go anywhere else, shoot into your arms?"     Right neck to shoulder  6. CORD SYMPTOMS: "Any weakness or numbness of the arms or legs?"     none 7. CAUSE: "What do you think is causing the neck pain?"     Possible heavy lifting 8. NECK OVERUSE: "Any recent activities that involved turning or twisting the neck?"     No  9. OTHER SYMPTOMS: "Do you  have any other symptoms?" (e.g., headache, fever, chest pain, difficulty breathing, neck swelling)     Denied chest pain, sob, swelling of right neck or shoulder 10. PREGNANCY: "Is there any chance you are pregnant?" "When was your last menstrual period?"       No  Protocols used: NECK PAIN OR STIFFNESS-A-AH

## 2017-10-19 ENCOUNTER — Encounter: Payer: Self-pay | Admitting: Family Medicine

## 2017-10-19 ENCOUNTER — Ambulatory Visit: Payer: BLUE CROSS/BLUE SHIELD | Admitting: Family Medicine

## 2017-10-19 VITALS — BP 112/66 | HR 68 | Temp 98.6°F | Ht 70.0 in | Wt 268.6 lb

## 2017-10-19 DIAGNOSIS — M47812 Spondylosis without myelopathy or radiculopathy, cervical region: Secondary | ICD-10-CM | POA: Diagnosis not present

## 2017-10-19 MED ORDER — METHOCARBAMOL 500 MG PO TABS: 500.0000 mg | ORAL_TABLET | Freq: Four times a day (QID) | ORAL | 1 refills | Status: DC | PRN

## 2017-10-19 MED ORDER — METHYLPREDNISOLONE 4 MG PO TBPK: ORAL_TABLET | ORAL | 0 refills | Status: DC

## 2017-10-19 NOTE — Progress Notes (Signed)
   Subjective:    Patient ID: Ruben AlpersCharles King, male    DOB: 09/29/1955, 62 y.o.   MRN: [id number]  HPI Here for 5 days of a sharp pain in the right neck that radiates into the right upper arm. No numbness or weakness. No recent trauma, but his job is very physical. He had a cervical spine MRI in2013 showing diffuse disc disease throughout, but there was more pronounced nerve root impingement at C3-4. Using heat and Diclofenac.    Review of Systems  Constitutional: Negative.   Respiratory: Negative.   Cardiovascular: Negative.   Musculoskeletal: Positive for neck pain and neck stiffness.       Objective:   Physical Exam  Constitutional: He appears well-developed and well-nourished.  Cardiovascular: Normal rate, regular rhythm, normal heart sounds and intact distal pulses.  Pulmonary/Chest: Effort normal and breath sounds normal. No respiratory distress. He has no wheezes. He has no rales.  Musculoskeletal:  He is tender over the posterior neck and into the superior right trapezius. ROM is limited by spasm and pain           Assessment & Plan:  Neck pain, likely a pinched nerve. Add a Medrol dose pack and Robaxin. Written out of work tomorrow until 10-25-17.  Gershon CraneStephen Fry, MD

## 2017-10-25 ENCOUNTER — Other Ambulatory Visit: Payer: Self-pay | Admitting: Family Medicine

## 2017-11-18 ENCOUNTER — Ambulatory Visit: Payer: BLUE CROSS/BLUE SHIELD | Admitting: Family Medicine

## 2017-11-18 ENCOUNTER — Encounter: Payer: Self-pay | Admitting: Family Medicine

## 2017-11-18 VITALS — BP 108/58 | HR 75 | Temp 98.4°F | Ht 70.0 in | Wt 262.2 lb

## 2017-11-18 DIAGNOSIS — M25512 Pain in left shoulder: Secondary | ICD-10-CM

## 2017-11-18 NOTE — Patient Instructions (Signed)
BEFORE YOU LEAVE: -RTC exercises -follow up: w/ PCP in 2-4 weeks  Discuss with your orthopedic specialist tomorrow as planned.  Topical menthol (tiger balm) and heat can help in addition to your regular treatments.  Follow up sooner as needed if worsening or other concerns.

## 2017-11-18 NOTE — Progress Notes (Signed)
HPI:  Using dictation device. Unfortunately this device frequently misinterprets words/phrases.  Acute visit for L shoulder pain: -started acutely 3 weeks ago when pulling a shirt out of the closet -pain in lateral shoulder, sharp - worse with certain movements of arm, reaching, abd, driving, etc -denies weakness, radiation, fevers, numbness, neck pain -has hx DDD, knee issues - seeing various specialist. Seeing his orthopedic doc tomorrow. -his diclofenac and muscle relaxer help some - takes for other issues -doing PT for R shoulder issues and they did a few exercises for the R as well but did not help  ROS: See pertinent positives and negatives per HPI.  Past Medical History:  Diagnosis Date  . ADHD (attention deficit hyperactivity disorder)   . Allergy   . Anemia    once in 20's  . Anxiety   . Cataract    removed right eye  . Constipation   . Depression   . Kidney stones 2000  . Migraines   . Sleep apnea    wears cpap     Past Surgical History:  Procedure Laterality Date  . BACK SURGERY  2001  . COLONOSCOPY     11 +yrs ago   . KNEE SURGERY Bilateral    X 3  . LITHOTRIPSY  2000   saw Urology in [place]  . REPLACEMENT TOTAL KNEE Right   . sleep apnea surgery    . TONSILLECTOMY  1961    Family History  Problem Relation Age of Onset  . Arthritis Mother   . Heart disease Mother 43       CABG  . Diabetes Mother   . Cancer Father        lung  . Esophageal cancer Father        was like an octopus wrapped around lungs and esophagus and aorta   . Heart disease Maternal Grandmother   . Diabetes Maternal Grandmother   . Heart disease Maternal Grandfather   . Colon cancer Neg Hx   . Colon polyps Neg Hx   . Rectal cancer Neg Hx   . Stomach cancer Neg Hx     SOCIAL HX: see hpi   Current Outpatient Medications:  .  ALPRAZolam (XANAX) 1 MG tablet, Take 1 mg by mouth. One tab in AM, one tab at lunchtime, 1-2 tabs at bedtime , Disp: , Rfl:  .  buPROPion  (WELLBUTRIN XL) 300 MG 24 hr tablet, Take 300 mg by mouth daily.  , Disp: , Rfl:  .  cetirizine (ZYRTEC) 10 MG tablet, Take 10 mg by mouth daily.  , Disp: , Rfl:  .  clindamycin (CLEOCIN) 300 MG capsule, Take 2 capsules po one hour prior to dental procedure., Disp: , Rfl:  .  diclofenac (VOLTAREN) 75 MG EC tablet, TAKE 1 TABLET BY MOUTH TWICE A DAY, Disp: 60 tablet, Rfl: 1 .  methocarbamol (ROBAXIN) 500 MG tablet, Take 1 tablet (500 mg total) by mouth every 6 (six) hours as needed for muscle spasms., Disp: 60 tablet, Rfl: 1 .  methylphenidate (RITALIN) 10 MG tablet, Take 10 mg by mouth 3 (three) times daily with meals. , Disp: , Rfl:  .  traZODone (DESYREL) 50 MG tablet, Take 50 mg by mouth. 1-2 tabs at bedtime as needed , Disp: , Rfl:   Current Facility-Administered Medications:  .  0.9 %  sodium chloride infusion, 500 mL, Intravenous, Once, Pyrtle, Carie Caddy, MD  EXAM:  Vitals:   11/18/17 1026  BP: (!) 108/58  Pulse: 75  Temp: 98.4 F (36.9 C)    Body mass index is 37.62 kg/m.  GENERAL: vitals reviewed and listed above, alert, oriented, appears well hydrated and in no acute distress  HEENT: atraumatic, conjunttiva clear, no obvious abnormalities on inspection of external nose and ears  NECK: no obvious masses on inspection suspect rotator cuff partial tear on the left  MS: Normal range of motion of the head and neck without reproduction of his symptoms, while his range of motion is preserved in most motions of the shoulder, motions above 90 degrees of shoulder abduction do cause him pain in the lateral shoulder, he has tenderness to palpation in the area of the rotator cuff attachment to the humerus on the left shoulder, positive impingement testing on the left, positive shawl sign also on the left, pain with empty can, but no weakness, neurovascularly intact throughout in the upper extremities bilaterally  PSYCH: pleasant and cooperative, no obvious depression or anxiety  ASSESSMENT  AND PLAN:  Discussed the following assessment and plan:  Acute pain of left shoulder  -we discussed possible serious and likely etiologies, workup and treatment, treatment risks and return precautions -suspect rotator cuff partial tear on the left -after this discussion, Geremiah opted for symptomatic conservative care with his current regimen plus the addition of topical menthol and heat, home exercise program for rotator cuff and also he plans to discuss with his orthopedic specialist tomorrow -follow up advised in 2 to 4 weeks as needed unless he is seeing his specialist for this issue -of course, we advised Jeanmarc  to return or notify a doctor immediately if symptoms worsen or persist or new concerns arise.    Patient Instructions  BEFORE YOU LEAVE: -RTC exercises -follow up: w/ PCP in 2-4 weeks  Discuss with your orthopedic specialist tomorrow as planned.  Topical menthol (tiger balm) and heat can help in addition to your regular treatments.  Follow up sooner as needed if worsening or other concerns.    Terressa Koyanagi, DO

## 2017-11-30 ENCOUNTER — Other Ambulatory Visit: Payer: Self-pay | Admitting: Family Medicine

## 2017-11-30 NOTE — Telephone Encounter (Signed)
If he got #60 with one refill in April should not need yet.  Was he aware of refill?

## 2017-11-30 NOTE — Telephone Encounter (Signed)
Last OV 11/18/2017   Last refilled 10/19/2017 disp 60 disp 1   Sent to PCP for apprval

## 2017-12-01 NOTE — Telephone Encounter (Signed)
Called and spoke with pt. Pt advised. Pt stated that he will check in withhis pharmacy and if he doesn't have refills I advised pt to call us back. CRM created.

## 2017-12-09 ENCOUNTER — Telehealth: Payer: Self-pay

## 2017-12-09 NOTE — Telephone Encounter (Signed)
Fax from [facility] @ [place]   Pt is requesting a refill for Methocarbam 500 MG   Last OV with Dr. Clent Ridges 10/19/2017   Last refilled 10/19/2017 disp 60 with 1 refill   Sent to Dr. Clent Ridges to advise PCP is [place]

## 2017-12-10 MED ORDER — METHOCARBAMOL 500 MG PO TABS: 500.0000 mg | ORAL_TABLET | Freq: Four times a day (QID) | ORAL | 0 refills | Status: DC | PRN

## 2017-12-10 NOTE — Telephone Encounter (Signed)
Rx has been called into pt's pharmacy.  

## 2017-12-10 NOTE — Telephone Encounter (Signed)
Refill once 

## 2017-12-15 ENCOUNTER — Other Ambulatory Visit: Payer: Self-pay | Admitting: Family Medicine

## 2017-12-16 NOTE — Telephone Encounter (Signed)
Refill OK

## 2017-12-17 ENCOUNTER — Ambulatory Visit: Payer: BLUE CROSS/BLUE SHIELD | Admitting: Family Medicine

## 2017-12-20 ENCOUNTER — Other Ambulatory Visit: Payer: Self-pay | Admitting: Family Medicine

## 2018-01-10 ENCOUNTER — Other Ambulatory Visit: Payer: Self-pay | Admitting: Family Medicine

## 2018-01-10 NOTE — Telephone Encounter (Signed)
Refill OK

## 2018-01-10 NOTE — Telephone Encounter (Signed)
Last refilled by Dr Clent RidgesFry 12/10/17.  Last office visit with Dr Selena BattenKim 11/18/17 and patient should follow up with PCP.  Okay to refill?

## 2018-01-18 ENCOUNTER — Ambulatory Visit: Payer: BLUE CROSS/BLUE SHIELD | Admitting: Family Medicine

## 2018-01-18 ENCOUNTER — Encounter: Payer: Self-pay | Admitting: Family Medicine

## 2018-01-18 VITALS — BP 110/70 | HR 68 | Temp 98.4°F | Wt 268.7 lb

## 2018-01-18 DIAGNOSIS — L02212 Cutaneous abscess of back [any part, except buttock]: Secondary | ICD-10-CM

## 2018-01-18 MED ORDER — DOXYCYCLINE HYCLATE 100 MG PO CAPS: 100.0000 mg | ORAL_CAPSULE | Freq: Two times a day (BID) | ORAL | 0 refills | Status: DC

## 2018-01-18 NOTE — Progress Notes (Signed)
  Subjective:     Patient ID: Ruben King, male   DOB: 05/30/1956, 62 y.o.   MRN: [id number]  HPI Patient seen with area of drainage mid thoracic back over the spine. He noticed soreness and some swelling several days ago. His daughter apparently applied some pressure and eventually drained some pus. He's been using multiple topicals including hydroperoxide. He has not had any fevers or chills. He has some itching. Less pain since this started draining a few days ago. He states some thick white substance also drained out when this first opened up  Past Medical History:  Diagnosis Date  . ADHD (attention deficit hyperactivity disorder)   . Allergy   . Anemia    once in 20's  . Anxiety   . Cataract    removed right eye  . Constipation   . Depression   . Kidney stones 2000  . Migraines   . Sleep apnea    wears cpap    Past Surgical History:  Procedure Laterality Date  . BACK SURGERY  2001  . COLONOSCOPY     11 +yrs ago   . KNEE SURGERY Bilateral    X 3  . LITHOTRIPSY  2000   [place] in [place]  . REPLACEMENT TOTAL KNEE Right   . sleep apnea surgery    . TONSILLECTOMY  1961    reports that he quit smoking about 9 years ago. His smoking use included cigarettes. He has a 2.50 pack-year smoking history. He has never used smokeless tobacco. He reports that he drinks alcohol. He reports that he does not use drugs. family history includes Arthritis in his mother; Cancer in his father; Diabetes in his maternal grandmother and mother; Esophageal cancer in his father; Heart disease in his maternal grandfather and maternal grandmother; Heart disease (age of onset: 3870) in his mother. Allergies  Allergen Reactions  . Penicillins Anaphylaxis  . Sulfa Antibiotics     As a child     Review of Systems  Constitutional: Negative for chills and fever.       Objective:   Physical Exam  Constitutional: He appears well-developed and well-nourished.  Cardiovascular: Normal rate  and regular rhythm.  Skin:  Patient has erythema about 3 x 2 cm mid thoracic back over the spine. Near the center he has approximately 1/2 cm area that is opened up and there some purulence draining from the center. No fluctuance. Minimally tender.       Assessment:     Spontaneously draining abscess mid thoracic spine probably from abscessed sebaceous cyst. Minimal cellulitis changes    Plan:     -Clean daily with good antibacterial soap and water -Keep covered until fully healed -Start doxycycline 100 mg twice daily for 7 days -Avoid hydrogen peroxide which may slow healing -Follow-up immediately for any fever, progressive redness, or other concerns  Kristian CoveyBruce W Briannia Laba MD  [facility]

## 2018-01-18 NOTE — Patient Instructions (Signed)
Clean wound daily with soap and water.  Start the Doxycycline one twice daily for one week  Let the wound drain.  Let me know if not closing over in 2 weeks.

## 2018-01-23 ENCOUNTER — Other Ambulatory Visit: Payer: Self-pay | Admitting: Family Medicine

## 2018-02-15 ENCOUNTER — Other Ambulatory Visit: Payer: Self-pay | Admitting: Family Medicine

## 2018-03-29 ENCOUNTER — Ambulatory Visit: Payer: BLUE CROSS/BLUE SHIELD | Admitting: Family Medicine

## 2018-03-29 ENCOUNTER — Other Ambulatory Visit: Payer: Self-pay

## 2018-03-29 ENCOUNTER — Encounter: Payer: Self-pay | Admitting: Family Medicine

## 2018-03-29 VITALS — BP 112/76 | HR 73 | Temp 98.3°F | Resp 15 | Ht 70.0 in | Wt 269.9 lb

## 2018-03-29 DIAGNOSIS — L02212 Cutaneous abscess of back [any part, except buttock]: Secondary | ICD-10-CM

## 2018-03-29 MED ORDER — DOXYCYCLINE HYCLATE 100 MG PO CAPS: 100.0000 mg | ORAL_CAPSULE | Freq: Two times a day (BID) | ORAL | 0 refills | Status: DC

## 2018-03-29 NOTE — Progress Notes (Signed)
  Subjective:     Patient ID: Ruben King, male   DOB: 08/27/1955, 62 y.o.   MRN: [id number]  HPI Patient seen with area of drainage mid thoracic back. He was seen here back in July with spontaneous drainage from abscessed cyst that his daughter had been applying pressure to. He has some mild surrounding cellulitis changes. We treated him then with doxycycline. This seem to be healing but then started draining again a few days ago. No fevers or chills. No prior history of MRSA. History of allergy to penicillin and sulfa.  Past Medical History:  Diagnosis Date  . ADHD (attention deficit hyperactivity disorder)   . Allergy   . Anemia    once in 20's  . Anxiety   . Cataract    removed right eye  . Constipation   . Depression   . Kidney stones 2000  . Migraines   . Sleep apnea    wears cpap    Past Surgical History:  Procedure Laterality Date  . BACK SURGERY  2001  . COLONOSCOPY     11 +yrs ago   . KNEE SURGERY Bilateral    X 3  . LITHOTRIPSY  2000   [place]  . REPLACEMENT TOTAL KNEE Right   . sleep apnea surgery    . TONSILLECTOMY  1961    reports that he quit smoking about 9 years ago. His smoking use included cigarettes. He has a 2.50 pack-year smoking history. He has never used smokeless tobacco. He reports that he drinks alcohol. He reports that he does not use drugs. family history includes Arthritis in his mother; Cancer in his father; Diabetes in his maternal grandmother and mother; Esophageal cancer in his father; Heart disease in his maternal grandfather and maternal grandmother; Heart disease (age of onset: 6170) in his mother. Allergies  Allergen Reactions  . Penicillins Anaphylaxis  . Sulfa Antibiotics     As a child     Review of Systems  Constitutional: Negative for chills and fever.       Objective:   Physical Exam  Skin:  He has eschar which is about 1 cm diameter mid thoracic back. He has minimal purulent drainage. No fluctuance.  No induration. No surrounding cellulitis changes.       Assessment:     Small abscess mid thoracic back which is spontaneously draining We did not see any nodular changes, etc to suggest skin malignancy.    Plan:     -keep clean with soap and water -no indication for I and D. -Start back doxycycline 100 mg twice daily for 10 days -Reassess in 2 weeks. If this is not fully healing over at that point consider biopsy  Kristian CoveyBruce W Chaysen Tillman MD [facility]

## 2018-03-29 NOTE — Patient Instructions (Signed)
Keep clean with soap and water  Start back the antibiotic  Let's plan to recheck in about 2 weeks..Marland Kitchen

## 2018-04-12 ENCOUNTER — Ambulatory Visit: Payer: BLUE CROSS/BLUE SHIELD | Admitting: Family Medicine

## 2018-04-12 ENCOUNTER — Encounter: Payer: Self-pay | Admitting: Family Medicine

## 2018-04-12 ENCOUNTER — Other Ambulatory Visit: Payer: Self-pay

## 2018-04-12 VITALS — BP 106/68 | HR 79 | Temp 98.1°F | Ht 70.0 in | Wt 270.0 lb

## 2018-04-12 DIAGNOSIS — L02212 Cutaneous abscess of back [any part, except buttock]: Secondary | ICD-10-CM | POA: Diagnosis not present

## 2018-04-12 NOTE — Patient Instructions (Signed)
Follow up for any recurrent redness, swelling, or drainage.

## 2018-04-12 NOTE — Progress Notes (Signed)
  Subjective:     Patient ID: Ruben King, male   DOB: [birth year], 62 y.o.   MRN: [id number]  HPI Patient seen regarding abscess mid thoracic back.  He had spontaneous drainage of abscess back in July and was treated with doxycycline and seem to be doing better but then had recurrence recently.  This was again spontaneously draining.  He was placed back on doxycycline and has seen great improvement at this time.  Eschar is much smaller.  Less swelling.  No pain.  No fever.  We had recommended follow-up today and possible biopsy if not healing  Past Medical History:  Diagnosis Date  . ADHD (attention deficit hyperactivity disorder)   . Allergy   . Anemia    once in 20's  . Anxiety   . Cataract    removed right eye  . Constipation   . Depression   . Kidney stones 2000  . Migraines   . Sleep apnea    wears cpap    Past Surgical History:  Procedure Laterality Date  . BACK SURGERY  2001  . COLONOSCOPY     11 +yrs ago   . KNEE SURGERY Bilateral    X 3  . LITHOTRIPSY  2000   saw Urology in [place]  . REPLACEMENT TOTAL KNEE Right   . sleep apnea surgery    . TONSILLECTOMY  1961    reports that he quit smoking about 9 years ago. His smoking use included cigarettes. He has a 2.50 pack-year smoking history. He has never used smokeless tobacco. He reports that he drinks alcohol. He reports that he does not use drugs. family history includes Arthritis in his mother; Cancer in his father; Diabetes in his maternal grandmother and mother; Esophageal cancer in his father; Heart disease in his maternal grandfather and maternal grandmother; Heart disease (age of onset: 48) in his mother. Allergies  Allergen Reactions  . Penicillins Anaphylaxis  . Sulfa Antibiotics     As a child     Review of Systems  Constitutional: Negative for chills and fever.       Objective:   Physical Exam  Constitutional: He appears well-developed and well-nourished.  Cardiovascular: Normal rate and  regular rhythm.  Pulmonary/Chest: Breath sounds normal. No respiratory distress.  Skin:  Very small eschar which is less than 1/2 cm diameter mid thoracic back.  No surrounding erythema.  No fluctuance.  Nontender.  No purulent drainage.  No nodular changes       Assessment:     Resolving abscess midthoracic back.    Plan:     No indication for any further antibiotics at this time.  Follow-up for any recurrent swelling, redness, or drainage  Kristian Covey MD [place] [facility] at [facility]

## 2018-04-28 ENCOUNTER — Other Ambulatory Visit: Payer: Self-pay | Admitting: Family Medicine

## 2018-05-30 ENCOUNTER — Ambulatory Visit: Payer: Self-pay

## 2018-05-30 NOTE — Telephone Encounter (Signed)
Outgoing call to to patient . Patient complains of ankle and legs swelling  Saturday am.  Patient states that he woke up and the were (bilaterally )  Swollen.  Went to work this am and his feet hurt so bad that he came home from work. Rate the severity severity. Pain rated moderate when sitting down.  Severe when standin On them.. Reports that that legs don"t look  Infected.   When asked if he haves a fever he states " no I don't think so".  Denies medical history . Reports this is first experience. Schedule for an appointment 05/31/18 at 1:40 pm.  Patient is to arrive at 1:25pm.  Patient voiced understanding.  Provided Care Advice.  Also voiced understanding.  Also.    Reason for Disposition . [1] MODERATE leg swelling (e.g., swelling extends up to knees) AND [2] new onset or worsening  Answer Assessment - Initial Assessment Questions 1. ONSET: "When did the swelling start?" (e.g., minutes, hours, days)      Started Sat 2. LOCATION: "What part of the leg is swollen?"  "Are both legs swollen or just one leg?"      bilateral 3. SEVERITY: "How bad is the swelling?" (e.g., localized; mild, moderate, severe)  - Localized - small area of swelling localized to one leg  - MILD pedal edema - swelling limited to foot and ankle, pitting edema < 1/4 inch (6 mm) deep, rest and elevation eliminate most or all swelling  - MODERATE edema - swelling of lower leg to knee, pitting edema > 1/4 inch (6 mm) deep, rest and elevation only partially reduce swelling  - SEVERE edema - swelling extends above knee, facial or hand swelling present      Severe 4. REDNESS: "Does the swelling look red or infected?"      no 5. PAIN: "Is the swelling painful to touch?" If so, ask: "How painful is it?"   (Scale 1-10; mild, moderate or severe)     If standing "bad if not moderat3 6. FEVER: "Do you have a fever?" If so, ask: "What is it, how was it measured, and when did it start?"      No I dont think so 7. CAUSE: "What do you  think is causing the leg swelling?"     New personal trainer exercise? 8. MEDICAL HISTORY: "Do you have a history of heart failure, kidney disease, liver failure, or cancer?"     no 9. RECURRENT SYMPTOM: "Have you had leg swelling before?" If so, ask: "When was the last time?" "What happened that time?"     no 10. OTHER SYMPTOMS: "Do you have any other symptoms?" (e.g., chest pain, difficulty breathing)       no 11. PREGNANCY: "Is there any chance you are pregnant?" "When was your last menstrual period?"  Protocols used: LEG SWELLING AND EDEMA-A-AH

## 2018-05-31 ENCOUNTER — Encounter: Payer: Self-pay | Admitting: Family Medicine

## 2018-05-31 ENCOUNTER — Other Ambulatory Visit: Payer: Self-pay

## 2018-05-31 ENCOUNTER — Ambulatory Visit: Payer: BLUE CROSS/BLUE SHIELD | Admitting: Family Medicine

## 2018-05-31 VITALS — BP 120/72 | HR 73 | Temp 97.9°F | Ht 70.0 in | Wt 275.1 lb

## 2018-05-31 DIAGNOSIS — I8393 Asymptomatic varicose veins of bilateral lower extremities: Secondary | ICD-10-CM | POA: Diagnosis not present

## 2018-05-31 DIAGNOSIS — R600 Localized edema: Secondary | ICD-10-CM

## 2018-05-31 LAB — TSH: TSH: 2.21 u[IU]/mL (ref 0.35–4.50)

## 2018-05-31 LAB — BRAIN NATRIURETIC PEPTIDE: PRO B NATRI PEPTIDE: 17 pg/mL (ref 0.0–100.0)

## 2018-05-31 LAB — COMPREHENSIVE METABOLIC PANEL
ALBUMIN: 4.4 g/dL (ref 3.5–5.2)
ALK PHOS: 48 U/L (ref 39–117)
ALT: 78 U/L — AB (ref 0–53)
AST: 78 U/L — ABNORMAL HIGH (ref 0–37)
BILIRUBIN TOTAL: 1 mg/dL (ref 0.2–1.2)
BUN: 19 mg/dL (ref 6–23)
CO2: 28 meq/L (ref 19–32)
CREATININE: 0.84 mg/dL (ref 0.40–1.50)
Calcium: 9.4 mg/dL (ref 8.4–10.5)
Chloride: 101 mEq/L (ref 96–112)
GFR: 98.32 mL/min (ref >60.00)
Glucose, Bld: 91 mg/dL (ref 70–99)
Potassium: 4.3 mEq/L (ref 3.5–5.1)
Sodium: 139 mEq/L (ref 135–145)
Total Protein: 6.7 g/dL (ref 6.0–8.3)

## 2018-05-31 MED ORDER — FUROSEMIDE 20 MG PO TABS: ORAL_TABLET | ORAL | 0 refills | Status: AC

## 2018-05-31 NOTE — Patient Instructions (Signed)
Edema Edema is an abnormal buildup of fluids in your bodytissues. Edema is somewhatdependent on gravity to pull the fluid to the lowest place in your body. That makes the condition more common in the legs and thighs (lower extremities). Painless swelling of the feet and ankles is common and becomes more likely as you get older. It is also common in looser tissues, like around your eyes. When the affected area is squeezed, the fluid may move out of that spot and leave a dent for a few moments. This dent is called pitting. What are the causes? There are many possible causes of edema. Eating too much salt and being on your feet or sitting for a long time can cause edema in your legs and ankles. Hot weather may make edema worse. Common medical causes of edema include:  Heart failure.  Liver disease.  Kidney disease.  Weak blood vessels in your legs.  Cancer.  An injury.  Pregnancy.  Some medications.  Obesity.  What are the signs or symptoms? Edema is usually painless.Your skin may look swollen or shiny. How is this diagnosed? Your health care provider may be able to diagnose edema by asking about your medical history and doing a physical exam. You may need to have tests such as X-rays, an electrocardiogram, or blood tests to check for medical conditions that may cause edema. How is this treated? Edema treatment depends on the cause. If you have heart, liver, or kidney disease, you need the treatment appropriate for these conditions. General treatment may include:  Elevation of the affected body part above the level of your heart.  Compression of the affected body part. Pressure from elastic bandages or support stockings squeezes the tissues and forces fluid back into the blood vessels. This keeps fluid from entering the tissues.  Restriction of fluid and salt intake.  Use of a water pill (diuretic). These medications are appropriate only for some types of edema. They pull fluid  out of your body and make you urinate more often. This gets rid of fluid and reduces swelling, but diuretics can have side effects. Only use diuretics as directed by your health care provider.  Follow these instructions at home:  Keep the affected body part above the level of your heart when you are lying down.  Do not sit still or stand for prolonged periods.  Do not put anything directly under your knees when lying down.  Do not wear constricting clothing or garters on your upper legs.  Exercise your legs to work the fluid back into your blood vessels. This may help the swelling go down.  Wear elastic bandages or support stockings to reduce ankle swelling as directed by your health care provider.  Eat a low-salt diet to reduce fluid if your health care provider recommends it.  Only take medicines as directed by your health care provider. Contact a health care provider if:  Your edema is not responding to treatment.  You have heart, liver, or kidney disease and notice symptoms of edema.  You have edema in your legs that does not improve after elevating them.  You have sudden and unexplained weight gain. Get help right away if:  You develop shortness of breath or chest pain.  You cannot breathe when you lie down.  You develop pain, redness, or warmth in the swollen areas.  You have heart, liver, or kidney disease and suddenly get edema.  You have a fever and your symptoms suddenly get worse. This information is   not intended to replace advice given to you by your health care provider. Make sure you discuss any questions you have with your health care provider. Document Released: 06/29/2005 Document Revised: 12/05/2015 Document Reviewed: 04/21/2013 Elsevier Interactive Patient Education  2017 [employer]  

## 2018-05-31 NOTE — Progress Notes (Signed)
Subjective:     Patient ID: Ruben King, male   DOB: 10/22/1955, 62 y.o.   MRN: [id number]  HPI  Bilateral foot and leg edema.  Onset this past Saturday.  He states Friday had some [place] food.  He has noticed his weight is up about 5 pounds from early October.  He has had some shoulder difficulties and not exercising regularly.  He does take diclofenac regularly.  No calcium channel blocker or any other medications likely contributing.  No dyspnea.  No orthopnea.  Edema seems to be present both morning and night.  He does have varicose veins and does not use any compression for that.  No recent chest pains.    Also has hx of OSA.  On CPAP.    Past Medical History:  Diagnosis Date  . ADHD (attention deficit hyperactivity disorder)   . Allergy   . Anemia    once in 20's  . Anxiety   . Cataract    removed right eye  . Constipation   . Depression   . Kidney stones 2000  . Migraines   . Sleep apnea    wears cpap    Past Surgical History:  Procedure Laterality Date  . BACK SURGERY  2001  . COLONOSCOPY     11 +yrs ago   . KNEE SURGERY Bilateral    X 3  . LITHOTRIPSY  2000   [place]  . REPLACEMENT TOTAL KNEE Right   . sleep apnea surgery    . TONSILLECTOMY  1961    reports that he quit smoking about 9 years ago. His smoking use included cigarettes. He has a 2.50 pack-year smoking history. He has never used smokeless tobacco. He reports that he drinks alcohol. He reports that he does not use drugs. family history includes Arthritis in his mother; Cancer in his father; Diabetes in his maternal grandmother and mother; Esophageal cancer in his father; Heart disease in his maternal grandfather and maternal grandmother; Heart disease (age of onset: 7970) in his mother. Allergies  Allergen Reactions  . Penicillins Anaphylaxis  . Sulfa Antibiotics     As a child     Review of Systems  Constitutional: Negative for fatigue.  Eyes: Negative for visual  disturbance.  Respiratory: Negative for cough, chest tightness and shortness of breath.   Cardiovascular: Positive for leg swelling. Negative for chest pain and palpitations.  Neurological: Negative for dizziness, syncope, weakness, light-headedness and headaches.       Objective:   Physical Exam  Constitutional: He is oriented to person, place, and time. He appears well-developed and well-nourished.  HENT:  Right Ear: External ear normal.  Left Ear: External ear normal.  Mouth/Throat: Oropharynx is clear and moist.  Eyes: Pupils are equal, round, and reactive to light.  Neck: Neck supple. No thyromegaly present.  Cardiovascular: Normal rate and regular rhythm.  Pulmonary/Chest: Effort normal and breath sounds normal. No respiratory distress. He has no wheezes. He has no rales.  Musculoskeletal: He exhibits edema.  Only trace pitting edema lower legs bilaterally.  He has prominent varicose veins bilaterally.  Neurological: He is alert and oriented to person, place, and time.       Assessment:     Bilateral leg edema of a few days duration.  Suspect most likely related to venous stasis.  He does have prominent varicose veins bilaterally.    Plan:     -Elevate legs frequently -Avoid high sodium diet -Check labs with TSH, basic metabolic  panel, BNP level -Wrote for limited furosemide 20 mg to take 1 daily for the next few days and then discontinue. -Discussed importance of venous compression which he will consider -Follow-up immediately for any dyspnea, progressive edema, or other concerns  Kristian Covey MD [facility] at [facility]

## 2018-06-24 ENCOUNTER — Other Ambulatory Visit: Payer: Self-pay | Admitting: Family Medicine

## 2018-08-24 ENCOUNTER — Other Ambulatory Visit: Payer: Self-pay

## 2018-08-24 ENCOUNTER — Encounter: Payer: Self-pay | Admitting: Family Medicine

## 2018-08-24 ENCOUNTER — Ambulatory Visit (INDEPENDENT_AMBULATORY_CARE_PROVIDER_SITE_OTHER): Payer: BLUE CROSS/BLUE SHIELD | Admitting: Family Medicine

## 2018-08-24 VITALS — BP 110/68 | HR 73 | Temp 97.9°F | Ht 70.0 in | Wt 256.9 lb

## 2018-08-24 DIAGNOSIS — R7401 Elevation of levels of liver transaminase levels: Secondary | ICD-10-CM

## 2018-08-24 DIAGNOSIS — M159 Polyosteoarthritis, unspecified: Secondary | ICD-10-CM

## 2018-08-24 DIAGNOSIS — R740 Nonspecific elevation of levels of transaminase and lactic acid dehydrogenase [LDH]: Secondary | ICD-10-CM

## 2018-08-24 DIAGNOSIS — M150 Primary generalized (osteo)arthritis: Secondary | ICD-10-CM | POA: Diagnosis not present

## 2018-08-24 NOTE — Progress Notes (Signed)
Subjective:     Patient ID: Ruben King, male   DOB: Sep 04, 1955, 63 y.o.   MRN: [id number]  HPI Patient here for the following issues  He is needing a letter to discuss some work restrictions.  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  Has had previous right total knee replacement.  He has had some recent issues with progressive left shoulder pain.  He has been told he has a nonsurgical shoulder problem and this does restrict his lifting overhead.  He has been on chronic work restrictions for several years.  Needs updated letter for his employer.  We obtained labs because of some peripheral edema issues he had back in November.  These came back significant for elevated ALT and AST transaminases.  No regular alcohol use.  His weight was up and we advised weight loss and has done excellent job thus far and has already lost about 17 pounds.  He has done this largely through calorie restriction.  No known history of fatty liver issues.  He does take Voltaren 75 mg and has been on this chronically for years.  No known family history of hemochromatosis, autoimmune hepatitis, or any other liver disorder.  Past Medical History:  Diagnosis Date  . ADHD (attention deficit hyperactivity disorder)   . Allergy   . Anemia    once in 20's  . Anxiety   . Cataract    removed right eye  . Constipation   . Depression   . Kidney stones 2000  . Migraines   . Sleep apnea    wears cpap    Past Surgical History:  Procedure Laterality Date  . BACK SURGERY  2001  . COLONOSCOPY     11 +yrs ago   . KNEE SURGERY Bilateral    X 3  . LITHOTRIPSY  2000   saw Urology in [place]  . REPLACEMENT TOTAL KNEE Right   . sleep apnea surgery    . TONSILLECTOMY  1961    reports that he quit smoking about 9 years ago. His smoking use included cigarettes. He has a 2.50 pack-year smoking history. He has never used  smokeless tobacco. He reports current alcohol use. He reports that he does not use drugs. family history includes Arthritis in his mother; Cancer in his father; Diabetes in his maternal grandmother and mother; Esophageal cancer in his father; Heart disease in his maternal grandfather and maternal grandmother; Heart disease (age of onset: 57) in his mother. Allergies  Allergen Reactions  . Penicillins Anaphylaxis  . Sulfa Antibiotics     As a child     Review of Systems  Constitutional: Negative for fatigue.  Eyes: Negative for visual disturbance.  Respiratory: Negative for cough, chest tightness and shortness of breath.   Cardiovascular: Negative for chest pain, palpitations and leg swelling.  Gastrointestinal: Negative for abdominal pain.  Musculoskeletal: Positive for arthralgias, back pain and neck pain.  Skin: Negative for rash.  Neurological: Negative for dizziness, syncope, weakness, light-headedness and headaches.  Hematological: Does not bruise/bleed easily.       Objective:   Physical Exam Constitutional:      Appearance: Normal appearance.  Neck:     Musculoskeletal: Neck supple.  Cardiovascular:     Rate and Rhythm: Normal rate and regular rhythm.  Pulmonary:     Effort: Pulmonary effort is normal.     Breath sounds: Normal breath sounds.  Abdominal:  Palpations: Abdomen is soft. There is no mass.     Tenderness: There is no abdominal tenderness. There is no guarding.     Comments: No hepatomegaly noted  Musculoskeletal:     Right lower leg: No edema.     Left lower leg: No edema.  Neurological:     Mental Status: He is alert.        Assessment:     #1 recent elevated liver transaminases.  Question related to hepatic steatosis.  #2 degenerative arthritis involving multiple joints    Plan:     -Repeat hepatic panel today.  Hopefully liver transaminases will be improved with his recent weight loss.  If not, consider further evaluation  -He is  encouraged to try to continue with weight loss efforts  -We will recommend discontinuation of Voltaren if liver enzymes are escalating or still elevated  -Will dictate letter specifying work restrictions  Kristian Covey MD [facility] at [facility]

## 2018-08-25 LAB — HEPATIC FUNCTION PANEL
ALBUMIN: 4.5 g/dL (ref 3.5–5.2)
ALK PHOS: 56 U/L (ref 39–117)
ALT: 18 U/L (ref 0–53)
AST: 14 U/L (ref 0–37)
Bilirubin, Direct: 0.2 mg/dL (ref 0.0–0.3)
TOTAL PROTEIN: 6.7 g/dL (ref 6.0–8.3)
Total Bilirubin: 0.9 mg/dL (ref 0.2–1.2)

## 2018-09-12 ENCOUNTER — Other Ambulatory Visit: Payer: Self-pay

## 2018-09-12 ENCOUNTER — Ambulatory Visit (INDEPENDENT_AMBULATORY_CARE_PROVIDER_SITE_OTHER): Payer: BLUE CROSS/BLUE SHIELD | Admitting: Family Medicine

## 2018-09-12 ENCOUNTER — Encounter: Payer: Self-pay | Admitting: Family Medicine

## 2018-09-12 VITALS — BP 108/62 | HR 78 | Temp 98.5°F | Ht 70.0 in | Wt 257.3 lb

## 2018-09-12 DIAGNOSIS — R740 Nonspecific elevation of levels of transaminase and lactic acid dehydrogenase [LDH]: Secondary | ICD-10-CM

## 2018-09-12 DIAGNOSIS — M159 Polyosteoarthritis, unspecified: Secondary | ICD-10-CM

## 2018-09-12 DIAGNOSIS — M150 Primary generalized (osteo)arthritis: Secondary | ICD-10-CM | POA: Diagnosis not present

## 2018-09-12 DIAGNOSIS — M109 Gout, unspecified: Secondary | ICD-10-CM | POA: Diagnosis not present

## 2018-09-12 DIAGNOSIS — R7401 Elevation of levels of liver transaminase levels: Secondary | ICD-10-CM

## 2018-09-12 MED ORDER — DICLOFENAC SODIUM 75 MG PO TBEC: 75.0000 mg | DELAYED_RELEASE_TABLET | Freq: Two times a day (BID) | ORAL | 1 refills | Status: DC

## 2018-09-12 MED ORDER — COLCHICINE 0.6 MG PO TABS: ORAL_TABLET | ORAL | 2 refills | Status: DC

## 2018-09-12 NOTE — Progress Notes (Signed)
Subjective:     Patient ID: Ruben King, male   DOB: [birth year], 63 y.o.   MRN: [id number]  HPI Patient is seen for the following issues  He had elevated liver transaminases back in November.  We suspected possible fatty liver changes.  He has done an incredible job with weight loss, exercise, and dietary change since then and has lost about 20 pounds.  Recent follow-up liver transaminases back to normal.  Does have osteoarthritis involving multiple joints takes diclofenac intermittently for that.  Requesting refills.  He also has history of gout which  predominantly affected the great toe in the past.  Requesting refills of colchicine.  Has had a few flareups in recent years.  We had prescribed allopurinol once previously but he never took this regularly.  He has longstanding history of obesity and hopes to lose several more pounds.  He is currently working with a Psychologist, educational.  Past Medical History:  Diagnosis Date  . ADHD (attention deficit hyperactivity disorder)   . Allergy   . Anemia    once in 20's  . Anxiety   . Cataract    removed right eye  . Constipation   . Depression   . Kidney stones 2000  . Migraines   . Sleep apnea    wears cpap    Past Surgical History:  Procedure Laterality Date  . BACK SURGERY  2001  . COLONOSCOPY     11 +yrs ago   . KNEE SURGERY Bilateral    X 3  . LITHOTRIPSY  2000   saw Urology in [place]  . REPLACEMENT TOTAL KNEE Right   . sleep apnea surgery    . TONSILLECTOMY  1961    reports that he quit smoking about 9 years ago. His smoking use included cigarettes. He has a 2.50 pack-year smoking history. He has never used smokeless tobacco. He reports current alcohol use. He reports that he does not use drugs. family history includes Arthritis in his mother; Cancer in his father; Diabetes in his maternal grandmother and mother; Esophageal cancer in his father; Heart disease in his maternal grandfather and maternal grandmother; Heart disease  (age of onset: 65) in his mother. Allergies  Allergen Reactions  . Penicillins Anaphylaxis  . Sulfa Antibiotics     As a child     Review of Systems  Constitutional: Negative for appetite change, chills, fever and unexpected weight change.  Respiratory: Negative for cough and shortness of breath.   Cardiovascular: Negative for chest pain.  Gastrointestinal: Negative for abdominal pain.  Musculoskeletal: Positive for arthralgias.  Hematological: Negative for adenopathy.       Objective:   Physical Exam Constitutional:      Appearance: Normal appearance.  Cardiovascular:     Rate and Rhythm: Normal rate and regular rhythm.  Pulmonary:     Effort: Pulmonary effort is normal.     Breath sounds: Normal breath sounds.  Abdominal:     Palpations: Abdomen is soft. There is no mass.     Tenderness: There is no abdominal tenderness.  Musculoskeletal:     Right lower leg: No edema.     Left lower leg: No edema.  Neurological:     Mental Status: He is alert.        Assessment:     #1 recent transient elevated liver transaminases.  Possibly related to fatty liver changes.  Resolved following weight loss  #2 history of gout  #3 osteoarthritis involving multiple joints    Plan:     -  Refilled colchicine for as needed use.  We discussed possible reinitiation of allopurinol for prevention if he has more frequent flareups going forward.  He is aware of potential triggers for gout  -Refilled diclofenac to use cautiously as needed for osteoarthritis pains.  He tries to avoid regular use.  He is aware of potential GI side effects including ulcer and also the fact that we will have to do regular liver monitoring every 6 months if he stays on this more regularly  -Continue weight loss efforts  Kristian Covey MD [facility]]

## 2018-09-12 NOTE — Patient Instructions (Signed)
Consider setting up physical later this year.  Congratulations on the weight loss.

## 2018-10-05 ENCOUNTER — Ambulatory Visit (INDEPENDENT_AMBULATORY_CARE_PROVIDER_SITE_OTHER): Payer: BLUE CROSS/BLUE SHIELD | Admitting: Family Medicine

## 2018-10-05 ENCOUNTER — Other Ambulatory Visit: Payer: Self-pay

## 2018-10-05 DIAGNOSIS — M25511 Pain in right shoulder: Secondary | ICD-10-CM

## 2018-10-05 MED ORDER — PREDNISONE 10 MG PO TABS: ORAL_TABLET | ORAL | 0 refills | Status: DC

## 2018-10-05 NOTE — Progress Notes (Signed)
Patient ID: Ruben King, male   DOB: 1956/07/01, 63 y.o.   MRN: [id number]  Virtual Visit via Video Note  I connected with [name] on 10/05/18 at  1:15 PM EDT by a video enabled telemedicine application and verified that I am speaking with the correct person using two identifiers.  Location patient: home Location provider:work or home office Persons participating in the virtual visit: patient, provider  I discussed the limitations of evaluation and management by telemedicine and the availability of in person appointments. The patient expressed understanding and agreed to proceed.  We attempted WebEx but due to technical difficulties were unable to get video monitor working appropriately.   HPI: Patient complaint of right shoulder pain.  Has had some chronic left shoulder pain.  Yesterday he states he was getting out of the shower and was drying off and when reaching behind with internal rotation type motion noticed some sudden pain right shoulder.  His location is mostly posterior shoulder region but poorly localized.  No neck pain.  No visible swelling or bruising.  He has pain mostly with movement but not at rest.  No upper extremity weakness or numbness.  He already takes diclofenac daily without much improvement.  Requesting prescription for prednisone.  He has scheduled physical therapy for left shoulder tomorrow.   ROS: See pertinent positives and negatives per HPI.  Past Medical History:  Diagnosis Date  . ADHD (attention deficit hyperactivity disorder)   . Allergy   . Anemia    once in 20's  . Anxiety   . Cataract    removed right eye  . Constipation   . Depression   . Kidney stones 2000  . Migraines   . Sleep apnea    wears cpap     Past Surgical History:  Procedure Laterality Date  . BACK SURGERY  2001  . COLONOSCOPY     11 +yrs ago   . KNEE SURGERY Bilateral    X 3  . LITHOTRIPSY  2000   saw Urology in [place]  . REPLACEMENT TOTAL KNEE Right   .  sleep apnea surgery    . TONSILLECTOMY  1961    Family History  Problem Relation Age of Onset  . Arthritis Mother   . Heart disease Mother 62       CABG  . Diabetes Mother   . Cancer Father        lung  . Esophageal cancer Father        was like an octopus wrapped around lungs and esophagus and aorta   . Heart disease Maternal Grandmother   . Diabetes Maternal Grandmother   . Heart disease Maternal Grandfather   . Colon cancer Neg Hx   . Colon polyps Neg Hx   . Rectal cancer Neg Hx   . Stomach cancer Neg Hx     SOCIAL HX: non-smoker   Current Outpatient Medications:  .  ALPRAZolam (XANAX) 1 MG tablet, Take 1 mg by mouth. Three times daily, Disp: , Rfl:  .  buPROPion (WELLBUTRIN XL) 300 MG 24 hr tablet, Take 300 mg by mouth daily.  , Disp: , Rfl:  .  cetirizine (ZYRTEC) 10 MG tablet, Take 10 mg by mouth daily.  , Disp: , Rfl:  .  clindamycin (CLEOCIN) 300 MG capsule, Take 2 capsules po one hour prior to dental procedure., Disp: , Rfl:  .  colchicine 0.6 MG tablet, One by mouth up to three times a day as needed for gout attack,  Disp: 90 tablet, Rfl: 3 .  colchicine 0.6 MG tablet, Take two at onset of gout flare and then one every 12 hours as needed., Disp: 60 tablet, Rfl: 2 .  diclofenac (VOLTAREN) 75 MG EC tablet, Take 1 tablet (75 mg total) by mouth 2 (two) times daily., Disp: 180 tablet, Rfl: 1 .  furosemide (LASIX) 20 MG tablet, Take one daily as needed for severe edema., Disp: 20 tablet, Rfl: 0 .  methocarbamol (ROBAXIN) 500 MG tablet, TAKE 1 TABLET BY MOUTH EVERY 6 HOURS AS NEEDED, Disp: 60 tablet, Rfl: 0 .  methylphenidate (RITALIN) 10 MG tablet, Take 10 mg by mouth 2 (two) times daily with breakfast and lunch. , Disp: , Rfl:  .  Sodium Fluoride (PREVIDENT 5000 BOOSTER PLUS) 1.1 % PSTE, USE BEFORE GOING TO BED, DO NOT RINSE, Disp: , Rfl:  .  traZODone (DESYREL) 150 MG tablet, TAKE 1 TABLET AS NEEDED SLEEP, Disp: , Rfl: 0  Current Facility-Administered Medications:  .   0.9 %  sodium chloride infusion, 500 mL, Intravenous, Once, Pyrtle, Carie Caddy, MD  EXAM:  VITALS per patient if applicable:  GENERAL: alert, oriented, appears well and in no acute distress  HEENT: atraumatic, conjunttiva clear, no obvious abnormalities on inspection of external nose and ears  NECK: normal movements of the head and neck  LUNGS: on inspection no signs of respiratory distress, breathing rate appears normal, no obvious gross SOB, gasping or wheezing  CV: no obvious cyanosis  MS: moves all visible extremities without noticeable abnormality  PSYCH/NEURO: pleasant and cooperative, no obvious depression or anxiety, speech and thought processing grossly intact  ASSESSMENT AND PLAN:  Discussed the following assessment and plan:  Acute right shoulder pain.  Patient has pain with internal rotation predominantly and may have rotator cuff injury.  -We have agreed to prednisone taper and start at 40 mg daily.  Take with food.  Avoid concomitant use of diclofenac  -Gentle range of motion as tolerated.  -Touch base in 2 to 3 weeks if not improving     I discussed the assessment and treatment plan with the patient. The patient was provided an opportunity to ask questions and all were answered. The patient agreed with the plan and demonstrated an understanding of the instructions.   The patient was advised to call back or seek an in-person evaluation if the symptoms worsen or if the condition fails to improve as anticipated.  I provided 15 minutes of non-face-to-face time during this encounter.   Evelena Peat, MD

## 2018-10-06 ENCOUNTER — Ambulatory Visit: Payer: Self-pay

## 2018-10-06 ENCOUNTER — Other Ambulatory Visit: Payer: Self-pay | Admitting: Family Medicine

## 2018-10-06 ENCOUNTER — Other Ambulatory Visit: Payer: Self-pay

## 2018-10-06 DIAGNOSIS — M25511 Pain in right shoulder: Secondary | ICD-10-CM

## 2018-11-05 ENCOUNTER — Other Ambulatory Visit: Payer: Self-pay | Admitting: Family Medicine

## 2018-12-06 ENCOUNTER — Other Ambulatory Visit: Payer: Self-pay | Admitting: Family Medicine

## 2018-12-06 ENCOUNTER — Telehealth: Payer: Self-pay | Admitting: Family Medicine

## 2018-12-06 ENCOUNTER — Ambulatory Visit (INDEPENDENT_AMBULATORY_CARE_PROVIDER_SITE_OTHER): Payer: BLUE CROSS/BLUE SHIELD | Admitting: Family Medicine

## 2018-12-06 ENCOUNTER — Other Ambulatory Visit: Payer: Self-pay

## 2018-12-06 DIAGNOSIS — Z20828 Contact with and (suspected) exposure to other viral communicable diseases: Secondary | ICD-10-CM

## 2018-12-06 DIAGNOSIS — Z0184 Encounter for antibody response examination: Secondary | ICD-10-CM

## 2018-12-06 DIAGNOSIS — Z20822 Contact with and (suspected) exposure to covid-19: Secondary | ICD-10-CM

## 2018-12-06 NOTE — Progress Notes (Signed)
Patient ID: Ruben King, male   DOB: [birth year], 63 y.o.   MRN: [id number]  This visit type was conducted due to national recommendations for restrictions regarding the COVID-19 pandemic in an effort to limit this patient's exposure and mitigate transmission in our community.   Virtual Visit via Video Note  I connected with [name] on 12/06/18 at 10:45 AM EDT by a video enabled telemedicine application and verified that I am speaking with the correct person using two identifiers.  Location patient: home Location provider:work or home office Persons participating in the virtual visit: patient, provider  I discussed the limitations of evaluation and management by telemedicine and the availability of in person appointments. The patient expressed understanding and agreed to proceed.   HPI: Patient has questions regarding COVID-19 screening.  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  Apparently, that coworker was asymptomatic.  He has not had any imminent exposure to that worker or anyone else with known COVID positive testing.  However, he states that his dentist will not do a crown procedure unless he has a negative COVID screen.  He is currently asymptomatic in terms of no cough, fever, chills, fatigue, body aches, sore throat.  He has had some mild nasal congestion which she attributes to allergies.  He has been fairly quarantined and consistent with practicing social distancing.   ROS: See pertinent positives and negatives per HPI.  Past Medical History:  Diagnosis Date  . ADHD (attention deficit hyperactivity disorder)   . Allergy   . Anemia    once in 20's  . Anxiety   . Cataract    removed right eye  . Constipation   . Depression   . Kidney stones 2000  . Migraines   . Sleep apnea    wears cpap     Past Surgical History:  Procedure Laterality Date  . BACK SURGERY  2001  . COLONOSCOPY     11 +yrs ago   . KNEE SURGERY  Bilateral    X 3  . LITHOTRIPSY  2000   saw Urology in [place]  . REPLACEMENT TOTAL KNEE Right   . sleep apnea surgery    . TONSILLECTOMY  1961    Family History  Problem Relation Age of Onset  . Arthritis Mother   . Heart disease Mother 64       CABG  . Diabetes Mother   . Cancer Father        lung  . Esophageal cancer Father        was like an octopus wrapped around lungs and esophagus and aorta   . Heart disease Maternal Grandmother   . Diabetes Maternal Grandmother   . Heart disease Maternal Grandfather   . Colon cancer Neg Hx   . Colon polyps Neg Hx   . Rectal cancer Neg Hx   . Stomach cancer Neg Hx     SOCIAL HX: Quit smoking 2010   Current Outpatient Medications:  .  ALPRAZolam (XANAX) 1 MG tablet, Take 1 mg by mouth. Three times daily, Disp: , Rfl:  .  buPROPion (WELLBUTRIN XL) 300 MG 24 hr tablet, Take 300 mg by mouth daily.  , Disp: , Rfl:  .  cetirizine (ZYRTEC) 10 MG tablet, Take 10 mg by mouth daily.  , Disp: , Rfl:  .  clindamycin (CLEOCIN) 300 MG capsule, Take 2 capsules po one hour prior to dental procedure., Disp: , Rfl:  .  colchicine 0.6 MG tablet, One by mouth up to three times a day as needed for gout attack, Disp: 90 tablet, Rfl: 3 .  colchicine 0.6 MG tablet, TAKE TWO AT ONSET OF GOUT FLARE AND THEN ONE EVERY 12 HOURS AS NEEDED., Disp: 60 tablet, Rfl: 2 .  diclofenac (VOLTAREN) 75 MG EC tablet, Take 1 tablet (75 mg total) by mouth 2 (two) times daily., Disp: 180 tablet, Rfl: 1 .  furosemide (LASIX) 20 MG tablet, Take one daily as needed for severe edema., Disp: 20 tablet, Rfl: 0 .  methocarbamol (ROBAXIN) 500 MG tablet, TAKE 1 TABLET BY MOUTH EVERY 6 HOURS AS NEEDED, Disp: 60 tablet, Rfl: 0 .  methylphenidate (RITALIN) 10 MG tablet, Take 10 mg by mouth 2 (two) times daily with breakfast and lunch. , Disp: , Rfl:  .  predniSONE (DELTASONE) 10 MG tablet, Taper as follows: 4-4-4-3-3-2-2-1-1, Disp: 24 tablet, Rfl: 0 .  Sodium Fluoride (PREVIDENT  5000 BOOSTER PLUS) 1.1 % PSTE, USE BEFORE GOING TO BED, DO NOT RINSE, Disp: , Rfl:  .  traZODone (DESYREL) 150 MG tablet, TAKE 1 TABLET AS NEEDED SLEEP, Disp: , Rfl: 0  Current Facility-Administered Medications:  .  0.9 %  sodium chloride infusion, 500 mL, Intravenous, Once, Pyrtle, Carie CaddyJay M, MD  EXAM:  VITALS per patient if applicable:  GENERAL: alert, oriented, appears well and in no acute distress  HEENT: atraumatic, conjunttiva clear, no obvious abnormalities on inspection of external nose and ears  NECK: normal movements of the head and neck  LUNGS: on inspection no signs of respiratory distress, breathing rate appears normal, no obvious gross SOB, gasping or wheezing  CV: no obvious cyanosis  MS: moves all visible extremities without noticeable abnormality  PSYCH/NEURO: pleasant and cooperative, no obvious depression or anxiety, speech and thought processing grossly intact  ASSESSMENT AND PLAN:  Discussed the following assessment and plan:  Immunity status testing  -We discussed various types of coronavirus testing.  We discussed difference between IgG testing and PCR testing.  Patient is asymptomatic but is requiring apparently a negative screen before his dentist will do a procedure as above.  -He is encouraged to continue with mitigation factors of social distancing, etc. in the meantime     I discussed the assessment and treatment plan with the patient. The patient was provided an opportunity to ask questions and all were answered. The patient agreed with the plan and demonstrated an understanding of the instructions.   The patient was advised to call back or seek an in-person evaluation if the symptoms worsen or if the condition fails to improve as anticipated.    Evelena PeatBruce Lashawnta Burgert, MD

## 2018-12-06 NOTE — Telephone Encounter (Signed)
Notified patient to schedule a covid-19 test. Test scheduled for today 12/06/2018 at 2:15 at the [facility]. Pt advised to wear a mask, stay in car with windows rolled up and until ready for testing. Advised that it is a drive thru test site. Pt voiced understanding.

## 2018-12-08 ENCOUNTER — Telehealth: Payer: Self-pay

## 2018-12-08 LAB — NOVEL CORONAVIRUS, NAA: SARS-CoV-2, NAA: NOT DETECTED

## 2018-12-08 NOTE — Telephone Encounter (Signed)
Please see message. °

## 2018-12-08 NOTE — Telephone Encounter (Signed)
Copied from CRM [id number]. Topic: General - Other >> Dec 08, 2018 11:35 AM Maia Petties wrote: Reason for CRM: Pt called stating he received call back that COVID19 testing was negative. He needs a letter or release for him to go to the dentist to get a permanent crown put in. They needed negative test before he can be scheduled.

## 2018-12-09 ENCOUNTER — Encounter: Payer: Self-pay | Admitting: Family Medicine

## 2018-12-09 NOTE — Telephone Encounter (Signed)
Called patient and let him know that his letter and lab results were ready to pick up at the front desk. Patient has a mask and will sanitize hands when he comes in.

## 2018-12-09 NOTE — Telephone Encounter (Signed)
Done.   Would also send lab result with the letter.

## 2019-03-14 ENCOUNTER — Other Ambulatory Visit: Payer: Self-pay | Admitting: Family Medicine

## 2019-08-20 ENCOUNTER — Other Ambulatory Visit: Payer: Self-pay | Admitting: Family Medicine

## 2019-09-05 ENCOUNTER — Emergency Department (HOSPITAL_COMMUNITY): Payer: BC Managed Care – PPO

## 2019-09-05 ENCOUNTER — Emergency Department (HOSPITAL_COMMUNITY)
Admission: EM | Admit: 2019-09-05 | Discharge: 2019-09-05 | Disposition: A | Discharge status: Home / Self Care | Payer: BC Managed Care – PPO | Attending: Emergency Medicine | Admitting: Emergency Medicine

## 2019-09-05 ENCOUNTER — Other Ambulatory Visit: Payer: Self-pay

## 2019-09-05 ENCOUNTER — Encounter (HOSPITAL_COMMUNITY): Payer: Self-pay | Admitting: Emergency Medicine

## 2019-09-05 DIAGNOSIS — Z96651 Presence of right artificial knee joint: Secondary | ICD-10-CM | POA: Diagnosis not present

## 2019-09-05 DIAGNOSIS — F909 Attention-deficit hyperactivity disorder, unspecified type: Secondary | ICD-10-CM | POA: Insufficient documentation

## 2019-09-05 DIAGNOSIS — Z87891 Personal history of nicotine dependence: Secondary | ICD-10-CM | POA: Insufficient documentation

## 2019-09-05 DIAGNOSIS — M79671 Pain in right foot: Secondary | ICD-10-CM | POA: Diagnosis present

## 2019-09-05 DIAGNOSIS — M722 Plantar fascial fibromatosis: Secondary | ICD-10-CM | POA: Diagnosis not present

## 2019-09-05 DIAGNOSIS — Z79899 Other long term (current) drug therapy: Secondary | ICD-10-CM | POA: Insufficient documentation

## 2019-09-05 HISTORY — DX: Gout, unspecified: M10.9

## 2019-09-05 MED ORDER — COLCHICINE 0.6 MG PO TABS
0.6000 mg | ORAL_TABLET | Freq: Once | ORAL | Status: AC
  Administered 2019-09-05: 0.6 mg via ORAL
  Filled 2019-09-05: qty 1

## 2019-09-05 MED ORDER — COLCHICINE 0.6 MG PO TABS
1.2000 mg | ORAL_TABLET | Freq: Once | ORAL | Status: AC
  Administered 2019-09-05: 1.2 mg via ORAL
  Filled 2019-09-05: qty 2

## 2019-09-05 NOTE — ED Provider Notes (Signed)
[facility]-EMERGENCY DEPT Provider Note   CSN: [id number] Arrival date & time: 09/05/19  7824     History Chief Complaint  Patient presents with  . Foot Pain    Ruben King is a 64 y.o. male.  64 yo M with a chief complaints of right foot pain.  Patient states that he went to work and when he got off work he realized that he had some pain to both of his feet.  This was worse to the plantar aspect of the foot.  He went to bed and then when he woke up he felt that the pain was severe and he had some erythema mostly on the outer aspect of the foot.  He put on his normal compression stockings and called his daughter to come take him to the emergency department.  Since then the pain has improved significantly and the erythema has resolved.  He has a history of gout but usually on the other side of the foot.  Recently went back to work and has been wearing steel toed shoes with poor support.  Remains on his feet for most of the workday.  Denies trauma.  The history is provided by the patient.  Foot Pain This is a new problem. The current episode started yesterday. The problem occurs constantly. The problem has not changed since onset.Pertinent negatives include no chest pain, no abdominal pain, no headaches and no shortness of breath. The symptoms are aggravated by bending, twisting, standing and walking. Nothing relieves the symptoms. He has tried nothing for the symptoms. The treatment provided no relief.       Past Medical History:  Diagnosis Date  . ADHD (attention deficit hyperactivity disorder)   . Allergy   . Anemia    once in 20's  . Anxiety   . Cataract    removed right eye  . Constipation   . Depression   . Gout   . Kidney stones 2000  . Migraines   . Sleep apnea    wears cpap     Patient Active Problem List   Diagnosis Date Noted  . Dyslipidemia 07/14/2017  . Cervical spondylosis without myelopathy 06/22/2016  . Status post right knee  replacement 12/31/2015  . Osteoarthritis of right knee 12/30/2015  . Prediabetes 07/12/2015  . Chronic back pain 06/26/2015  . Primary osteoarthritis of right knee 10/28/2014  . Gout 01/02/2011  . Toe pain, left 12/16/2010  . OSA (obstructive sleep apnea) 10/17/2010  . Migraines 10/17/2010  . Chronic insomnia 10/17/2010  . Attention deficit disorder 10/17/2010  . Sprain and strain of other specified sites of shoulder and upper arm 06/24/2009  . Osteoarthritis of multiple joints 06/25/2008  . Arthropathy 02/16/2008  . Bronchitis, not specified as acute or chronic 06/21/2007  . Allergic rhinitis 05/16/2007    Past Surgical History:  Procedure Laterality Date  . BACK SURGERY  2001  . COLONOSCOPY     11 +yrs ago   . KNEE SURGERY Bilateral    X 3  . LITHOTRIPSY  2000   saw Urology in [place]  . REPLACEMENT TOTAL KNEE Right   . sleep apnea surgery    . TONSILLECTOMY  1961       Family History  Problem Relation Age of Onset  . Arthritis Mother   . Heart disease Mother 56       CABG  . Diabetes Mother   . Cancer Father        lung  . Esophageal  cancer Father        was like an octopus wrapped around lungs and esophagus and aorta   . Heart disease Maternal Grandmother   . Diabetes Maternal Grandmother   . Heart disease Maternal Grandfather   . Colon cancer Neg Hx   . Colon polyps Neg Hx   . Rectal cancer Neg Hx   . Stomach cancer Neg Hx     Social History   Tobacco Use  . Smoking status: Former Smoker    Packs/day: 0.50    Years: 5.00    Pack years: 2.50    Types: Cigarettes    Quit date: 10/15/2008    Years since quitting: 10.8  . Smokeless tobacco: Never Used  Substance Use Topics  . Alcohol use: Yes    Comment: occasionally   . Drug use: No    Home Medications Prior to Admission medications   Medication Sig Start Date End Date Taking? Authorizing Provider  ALPRAZolam Prudy Feeler) 1 MG tablet Take 1 mg by mouth 3 (three) times daily. Three times daily    Yes [provider]  buPROPion (WELLBUTRIN XL) 300 MG 24 hr tablet Take 300 mg by mouth daily.     Yes [provider]  cetirizine (ZYRTEC) 10 MG tablet Take 10 mg by mouth daily.     Yes [provider]  colchicine 0.6 MG tablet TAKE TWO AT ONSET OF GOUT FLARE AND THEN ONE EVERY 12 HOURS AS NEEDED. Patient taking differently: Take 0.6 mg by mouth See admin instructions. Take two at onset of gout flare and then one every 12 hours as needed. 11/07/18  Yes Burchette, Elberta Fortis, MD  diclofenac (VOLTAREN) 75 MG EC tablet TAKE 1 TABLET BY MOUTH TWICE A DAY Patient taking differently: Take 75 mg by mouth 2 (two) times daily as needed for mild pain.  08/21/19  Yes Burchette, Elberta Fortis, MD  furosemide (LASIX) 20 MG tablet Take one daily as needed for severe edema. 05/31/18  Yes Burchette, Elberta Fortis, MD  methocarbamol (ROBAXIN) 500 MG tablet TAKE 1 TABLET BY MOUTH EVERY 6 HOURS AS NEEDED Patient taking differently: Take 500 mg by mouth daily as needed.  01/10/18  Yes Burchette, Elberta Fortis, MD  methylphenidate (RITALIN) 10 MG tablet Take 10 mg by mouth 2 (two) times daily with breakfast and lunch.    Yes [provider]  Multiple Vitamins-Minerals (MULTIVITAMIN WITH MINERALS) tablet Take 1 tablet by mouth daily.   Yes [provider]  Sodium Fluoride (PREVIDENT 5000 BOOSTER PLUS) 1.1 % PSTE 1 application by Mouth Rinse route at bedtime.  01/21/18  Yes [provider]  traZODone (DESYREL) 150 MG tablet Take 100 mg by mouth at bedtime.  01/11/18  Yes [provider]  TURMERIC PO Take 1 tablet by mouth daily at 12 noon.   Yes [provider]  clindamycin (CLEOCIN) 300 MG capsule Take 600 mg by mouth once. Prior to Denta 01/15/17   [provider]  colchicine 0.6 MG tablet One by mouth up to three times a day as needed for gout attack Patient not taking: Reported on 09/05/2019 04/01/11 09/12/19  Kristian Covey, MD  predniSONE (DELTASONE) 10 MG tablet  Taper as follows: 4-4-4-3-3-2-2-1-1 Patient not taking: Reported on 09/05/2019 10/05/18   Kristian Covey, MD    Allergies    Penicillins and Sulfa antibiotics  Review of Systems   Review of Systems  Constitutional: Negative for chills and fever.  HENT: Negative for congestion and facial  swelling.   Eyes: Negative for discharge and visual disturbance.  Respiratory: Negative for shortness of breath.   Cardiovascular: Negative for chest pain and palpitations.  Gastrointestinal: Negative for abdominal pain, diarrhea and vomiting.  Musculoskeletal: Positive for arthralgias. Negative for myalgias.  Skin: Negative for color change and rash.  Neurological: Negative for tremors, syncope and headaches.  Psychiatric/Behavioral: Negative for confusion and dysphoric mood.    Physical Exam Updated Vital Signs BP 115/84   Pulse 78   Temp 97.7 F (36.5 C) (Oral)   Resp 15   SpO2 96%   Physical Exam Vitals and nursing note reviewed.  Constitutional:      Appearance: He is well-developed.  HENT:     Head: Normocephalic and atraumatic.  Eyes:     Pupils: Pupils are equal, round, and reactive to light.  Neck:     Vascular: No JVD.  Cardiovascular:     Rate and Rhythm: Normal rate and regular rhythm.     Heart sounds: No murmur. No friction rub. No gallop.   Pulmonary:     Effort: No respiratory distress.     Breath sounds: No wheezing.  Abdominal:     General: There is no distension.     Tenderness: There is no guarding or rebound.  Musculoskeletal:        General: Tenderness present. Normal range of motion.     Cervical back: Normal range of motion and neck supple.     Comments: Some mild tenderness at the base of the fifth metatarsal.  No erythema no warmth.  Pulse motor and sensation are intact to the right lower extremity.  Skin:    Coloration: Skin is not pale.     Findings: No rash.  Neurological:     Mental Status: He is alert and oriented to person, place, and time.    Psychiatric:        Behavior: Behavior normal.     ED Results / Procedures / Treatments   Labs (all labs ordered are listed, but only abnormal results are displayed) Labs Reviewed - No data to display  EKG None  Radiology DG Foot Complete Right  Result Date: 09/05/2019 CLINICAL DATA:  Pain at the fifth metatarsal base for 3 days. No known injury EXAM: RIGHT FOOT COMPLETE - 3+ VIEW COMPARISON:  None. FINDINGS: There is no evidence of fracture or dislocation. Enthesopathic changes are noted at the fifth metatarsal base. Small os peroneum. Small bidirectional calcaneal enthesophytes. Mild dorsal capsular hypertrophy at the talonavicular joint. There is no evidence of arthropathy or other focal bone abnormality. Soft tissues are unremarkable. IMPRESSION: 1. No acute osseous abnormality. 2. Mild enthesopathic changes at the fifth metatarsal base. Electronically Signed   By: Davina Poke D.O.   On: 09/05/2019 10:51    Procedures Procedures (including critical care time)  Medications Ordered in ED Medications  colchicine tablet 1.2 mg (1.2 mg Oral Given 09/05/19 1020)  colchicine tablet 0.6 mg (0.6 mg Oral Provided for home use 09/05/19 1137)    ED Course  I have reviewed the triage vital signs and the nursing notes.  Pertinent labs & imaging results that were available during my care of the patient were reviewed by me and considered in my medical decision making (see chart for details).    MDM Rules/Calculators/A&P                      64 yo M with a chief complaint of right foot pain.  Clinically sounds like plantar fasciitis the patient is gone back to work and has poor supportive shoes and spends most the time on his feet.  Pain was worse first thing in the morning with his initial steps now is resolved.  Since his pain is to the base of the fifth metatarsal I will obtain a plain film.  He also has a history of gout and with the erythema we will give him the 2 dose colchicine  treatment.  Plain film viewed by me without fracture.  Placed in a cam walker.  PCP and podiatry follow-up.  1:38 PM:  I have discussed the diagnosis/risks/treatment options with the patient and believe the pt to be eligible for discharge home to follow-up with PCP, Podiatry. We also discussed returning to the ED immediately if new or worsening sx occur. We discussed the sx which are most concerning (e.g., sudden worsening pain, fever, inability to tolerate by mouth) that necessitate immediate return. Medications administered to the patient during their visit and any new prescriptions provided to the patient are listed below.  Medications given during this visit Medications  colchicine tablet 1.2 mg (1.2 mg Oral Given 09/05/19 1020)  colchicine tablet 0.6 mg (0.6 mg Oral Provided for home use 09/05/19 1137)     The patient appears reasonably screen and/or stabilized for discharge and I doubt any other medical condition or other [facility] requiring further screening, evaluation, or treatment in the ED at this time prior to discharge.   Final Clinical Impression(s) / ED Diagnoses Final diagnoses:  Plantar fasciitis of right foot    Rx / DC Orders ED Discharge Orders    None       Melene Plan, DO 09/05/19 1338

## 2019-09-05 NOTE — ED Triage Notes (Signed)
Right foot pain that started last night. Non radiating. +mild swelling, mild relief with compression stocking. Denies injury, fever/chills, numbness or tingling. Hx of arthritis and gout.

## 2019-09-05 NOTE — Discharge Instructions (Signed)
Follow up with your family doc and podiatrist.

## 2019-09-06 ENCOUNTER — Other Ambulatory Visit: Payer: Self-pay

## 2019-09-06 ENCOUNTER — Encounter: Payer: Self-pay | Admitting: Family Medicine

## 2019-09-06 ENCOUNTER — Ambulatory Visit (INDEPENDENT_AMBULATORY_CARE_PROVIDER_SITE_OTHER): Payer: BC Managed Care – PPO | Admitting: Family Medicine

## 2019-09-06 VITALS — BP 110/80 | HR 75 | Temp 97.7°F | Wt 270.6 lb

## 2019-09-06 DIAGNOSIS — M7741 Metatarsalgia, right foot: Secondary | ICD-10-CM

## 2019-09-06 DIAGNOSIS — M722 Plantar fascial fibromatosis: Secondary | ICD-10-CM | POA: Diagnosis not present

## 2019-09-06 NOTE — Patient Instructions (Addendum)
I think you have some metatarsalgia  You could try some OTC metatarsal pads that might reduce the forefoot pain.  Plantar fibromatosis.

## 2019-09-06 NOTE — Progress Notes (Signed)
Subjective:     Patient ID: Ruben King, male   DOB: [birth year], 64 y.o.   MRN: [id number]  HPI   Ruben King is seen for ER follow-up.  He states over the weekend he developed some rather acute swelling and redness mostly involving the right lateral foot.  He denied any injury.  He works on his feet on hard floors much of the day and has recently noticed some pain in both feet.  He noticed some pain on the plantar aspect of the right foot predominantly.  He noticed some increased swelling by Monday and placed on his compression socks and this seemed to help with swelling and redness immediately.  He wears steel toed shoes at work.  These have relatively poor support.  He has recently noticed some thickening along the mid aspect of the plantar fascial band.  He did have some mild tenderness base of the right fifth metatarsal.  He had x-rays which showed no acute bony abnormality.  X-rays were reviewed from ER visit.  No fracture.  He has been taking some colchicine and feels her symptoms are slightly improved today.  He had declined orthotics previously per podiatry.  He was placed in a cam walker but states that felt more uncomfortable with that.  Past Medical History:  Diagnosis Date  . ADHD (attention deficit hyperactivity disorder)   . Allergy   . Anemia    once in 20's  . Anxiety   . Cataract    removed right eye  . Constipation   . Depression   . Gout   . Kidney stones 2000  . Migraines   . Sleep apnea    wears cpap    Past Surgical History:  Procedure Laterality Date  . BACK SURGERY  2001  . COLONOSCOPY     11 +yrs ago   . KNEE SURGERY Bilateral    X 3  . LITHOTRIPSY  2000   saw Urology in [place]  . REPLACEMENT TOTAL KNEE Right   . sleep apnea surgery    . TONSILLECTOMY  1961    reports that he quit smoking about 10 years ago. His smoking use included cigarettes. He has a 2.50 pack-year smoking history. He has never used smokeless tobacco. He reports current  alcohol use. He reports that he does not use drugs. family history includes Arthritis in his mother; Cancer in his father; Diabetes in his maternal grandmother and mother; Esophageal cancer in his father; Heart disease in his maternal grandfather and maternal grandmother; Heart disease (age of onset: 64) in his mother. Allergies  Allergen Reactions  . Penicillins Anaphylaxis    Did it involve swelling of the face/tongue/throat, SOB, or low BP? No Did it involve sudden or severe rash/hives, skin peeling, or any reaction on the inside of your mouth or nose? Yes Did you need to seek medical attention at a hospital or doctor's office? Yes When did it last happen?Childhood If all above answers are "NO", may proceed with cephalosporin use.  . Sulfa Antibiotics     As a child     Review of Systems  Constitutional: Negative for fatigue.  Eyes: Negative for visual disturbance.  Respiratory: Negative for cough, chest tightness and shortness of breath.   Cardiovascular: Negative for chest pain, palpitations and leg swelling.  Skin: Negative for rash.  Neurological: Negative for dizziness, syncope, weakness, light-headedness and headaches.       Objective:   Physical Exam Vitals reviewed.  Constitutional:  Appearance: Normal appearance.  Cardiovascular:     Rate and Rhythm: Normal rate and regular rhythm.  Musculoskeletal:     Comments: Right leg reveals no edema.  No ankle edema.  Right foot reveals no redness or increased warmth.  No ecchymosis.  He has some very minimal tenderness along the base of the fifth metatarsal.  Full range of motion ankle.  No metatarsal tenderness otherwise.  He has some mild thickening on the mid aspect of the plantar fascia.  No heel tenderness. Main area of tenderness is along the metatarsal heads especially second through fourth.  No callus.  No ulceration.  Neurological:     Mental Status: He is alert.        Assessment:     Right foot pain.   Suspect metatarsalgia.  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  -reviewed stretches for plantar fascia. -consider OTC topical Diclofenac gel -needs CPE and encouraged to set up.  Kristian Covey MD [facility] at [facility]

## 2019-09-22 ENCOUNTER — Other Ambulatory Visit: Payer: Self-pay | Admitting: Family Medicine

## 2019-11-03 ENCOUNTER — Encounter: Payer: Self-pay | Admitting: Family Medicine

## 2019-11-03 ENCOUNTER — Telehealth: Payer: Self-pay | Admitting: Family Medicine

## 2019-11-03 ENCOUNTER — Ambulatory Visit (INDEPENDENT_AMBULATORY_CARE_PROVIDER_SITE_OTHER): Payer: BC Managed Care – PPO | Admitting: Family Medicine

## 2019-11-03 ENCOUNTER — Other Ambulatory Visit: Payer: Self-pay

## 2019-11-03 VITALS — BP 110/64 | HR 64 | Temp 97.6°F | Wt 262.0 lb

## 2019-11-03 DIAGNOSIS — E785 Hyperlipidemia, unspecified: Secondary | ICD-10-CM

## 2019-11-03 DIAGNOSIS — Z125 Encounter for screening for malignant neoplasm of prostate: Secondary | ICD-10-CM

## 2019-11-03 DIAGNOSIS — Z0000 Encounter for general adult medical examination without abnormal findings: Secondary | ICD-10-CM | POA: Diagnosis not present

## 2019-11-03 DIAGNOSIS — M109 Gout, unspecified: Secondary | ICD-10-CM | POA: Diagnosis not present

## 2019-11-03 LAB — CBC WITH DIFFERENTIAL/PLATELET
Basophils Absolute: 0 10*3/uL (ref 0.0–0.1)
Basophils Relative: 0.8 % (ref 0.0–3.0)
Eosinophils Absolute: 0.3 10*3/uL (ref 0.0–0.7)
Eosinophils Relative: 7.4 % — ABNORMAL HIGH (ref 0.0–5.0)
HCT: 40.6 % (ref 39.0–52.0)
Hemoglobin: 14 g/dL (ref 13.0–17.0)
Lymphocytes Relative: 16.9 % (ref 12.0–46.0)
Lymphs Abs: 0.6 10*3/uL — ABNORMAL LOW (ref 0.7–4.0)
MCHC: 34.5 g/dL (ref 30.0–36.0)
MCV: 90.2 fl (ref 78.0–100.0)
Monocytes Absolute: 0.5 10*3/uL (ref 0.1–1.0)
Monocytes Relative: 13.2 % — ABNORMAL HIGH (ref 3.0–12.0)
Neutro Abs: 2.3 10*3/uL (ref 1.4–7.7)
Neutrophils Relative %: 61.7 % (ref 43.0–77.0)
Platelets: 168 10*3/uL (ref 150.0–400.0)
RBC: 4.5 Mil/uL (ref 4.22–5.81)
RDW: 13.7 % (ref 11.5–15.5)
WBC: 3.7 10*3/uL — ABNORMAL LOW (ref 4.0–10.5)

## 2019-11-03 LAB — BASIC METABOLIC PANEL
BUN: 17 mg/dL (ref 6–23)
CO2: 33 mEq/L — ABNORMAL HIGH (ref 19–32)
Calcium: 9.1 mg/dL (ref 8.4–10.5)
Chloride: 100 mEq/L (ref 96–112)
Creatinine, Ser: 0.92 mg/dL (ref 0.40–1.50)
GFR: 82.9 mL/min (ref >60.00)
Glucose, Bld: 97 mg/dL (ref 70–99)
Potassium: 4.4 mEq/L (ref 3.5–5.1)
Sodium: 140 mEq/L (ref 135–145)

## 2019-11-03 LAB — HEPATIC FUNCTION PANEL
ALT: 33 U/L (ref 0–53)
AST: 17 U/L (ref 0–37)
Albumin: 4.4 g/dL (ref 3.5–5.2)
Alkaline Phosphatase: 56 U/L (ref 39–117)
Bilirubin, Direct: 0.1 mg/dL (ref 0.0–0.3)
Total Bilirubin: 0.6 mg/dL (ref 0.2–1.2)
Total Protein: 6.7 g/dL (ref 6.0–8.3)

## 2019-11-03 LAB — LIPID PANEL
Cholesterol: 158 mg/dL (ref 0–200)
HDL: 72.1 mg/dL (ref >39.00)
LDL Cholesterol: 74 mg/dL (ref 0–99)
NonHDL: 85.46
Total CHOL/HDL Ratio: 2
Triglycerides: 59 mg/dL (ref 0.0–149.0)
VLDL: 11.8 mg/dL (ref 0.0–40.0)

## 2019-11-03 LAB — PSA: PSA: 0.57 ng/mL (ref 0.10–4.00)

## 2019-11-03 LAB — TSH: TSH: 1.93 u[IU]/mL (ref 0.35–4.50)

## 2019-11-03 MED ORDER — ALLOPURINOL 100 MG PO TABS: ORAL_TABLET | ORAL | 11 refills | Status: DC

## 2019-11-03 NOTE — Telephone Encounter (Signed)
Patient needs letter from visit to have 8 hr restriction added to the letter.    Please fax to: [phone], Attn: Burke Keels

## 2019-11-03 NOTE — Progress Notes (Signed)
Subjective:     Patient ID: Ruben King, male   DOB: 01/18/1956, 64 y.o.   MRN: [id number]  HPI Ruben King is seen for physical exam and to discuss medical issue as below.  Medical problems reviewed.  He has history of migraine headaches, allergic rhinitis, obstructive sleep apnea, obesity, osteoarthritis involving multiple joints, chronic low back pain, dyslipidemia, and gout.  Has had some recent flareups regarding particularly his right foot.  He had involvement of the right great toe MTP joint frequently in the past.  He was on allopurinol briefly.  Not clear why stopped.  He does not recall any intolerance.  He is taking colchicine as needed.  This is been costly.  He would like to consider prevention.  Health maintenance reviewed.  Colonoscopy up-to-date.  Tetanus up-to-date.  He has received Covid vaccine.  Has received previous Shingrix.  No history of hepatitis C screening.  He is low risk.  Currently does not exercise.  He knows he needs to lose some weight.  Family history of type 2 diabetes in his mother.  Past Medical History:  Diagnosis Date  . ADHD (attention deficit hyperactivity disorder)   . Allergy   . Anemia    once in 20's  . Anxiety   . Cataract    removed right eye  . Constipation   . Depression   . Gout   . Kidney stones 2000  . Migraines   . Sleep apnea    wears cpap    Past Surgical History:  Procedure Laterality Date  . BACK SURGERY  2001  . COLONOSCOPY     11 +yrs ago   . KNEE SURGERY Bilateral    X 3  . LITHOTRIPSY  2000   saw Urology in [place]  . REPLACEMENT TOTAL KNEE Right   . sleep apnea surgery    . TONSILLECTOMY  1961    reports that he quit smoking about 11 years ago. His smoking use included cigarettes. He has a 2.50 pack-year smoking history. He has never used smokeless tobacco. He reports current alcohol use. He reports that he does not use drugs. family history includes Arthritis in his mother; Cancer in his father; Diabetes in  his maternal grandmother and mother; Esophageal cancer in his father; Heart disease in his maternal grandfather and maternal grandmother; Heart disease (age of onset: 50) in his mother. Allergies  Allergen Reactions  . Penicillins Anaphylaxis    Did it involve swelling of the face/tongue/throat, SOB, or low BP? No Did it involve sudden or severe rash/hives, skin peeling, or any reaction on the inside of your mouth or nose? Yes Did you need to seek medical attention at a hospital or doctor's office? Yes When did it last happen?Childhood If all above answers are "NO", may proceed with cephalosporin use.  . Sulfa Antibiotics     As a child     Review of Systems  Constitutional: Negative for activity change, appetite change, fatigue, fever and unexpected weight change.  HENT: Negative for congestion, ear pain and trouble swallowing.   Eyes: Negative for pain and visual disturbance.  Respiratory: Negative for cough, shortness of breath and wheezing.   Cardiovascular: Negative for chest pain and palpitations.  Gastrointestinal: Negative for abdominal distention, abdominal pain, blood in stool, constipation, diarrhea, nausea, rectal pain and vomiting.  Genitourinary: Negative for dysuria, hematuria and testicular pain.  Musculoskeletal: Positive for arthralgias. Negative for joint swelling.  Skin: Negative for rash.  Neurological: Negative for dizziness, syncope and headaches.  Hematological: Negative for adenopathy.  Psychiatric/Behavioral: Negative for confusion and dysphoric mood.       Objective:   Physical Exam Constitutional:      General: He is not in acute distress.    Appearance: He is well-developed.  HENT:     Head: Normocephalic and atraumatic.     Right Ear: External ear normal.     Left Ear: External ear normal.  Eyes:     Conjunctiva/sclera: Conjunctivae normal.     Pupils: Pupils are equal, round, and reactive to light.  Neck:     Thyroid: No thyromegaly.   Cardiovascular:     Rate and Rhythm: Normal rate and regular rhythm.     Heart sounds: Normal heart sounds. No murmur.  Pulmonary:     Effort: No respiratory distress.     Breath sounds: No wheezing or rales.  Abdominal:     General: Bowel sounds are normal. There is no distension.     Palpations: Abdomen is soft. There is no mass.     Tenderness: There is no abdominal tenderness. There is no guarding or rebound.  Musculoskeletal:     Cervical back: Normal range of motion and neck supple.     Right lower leg: No edema.     Left lower leg: No edema.  Lymphadenopathy:     Cervical: No cervical adenopathy.  Skin:    Findings: No rash.     Comments: He has small epidermal cyst right anterior chest wall.  Nontender and nonfluctuant  Neurological:     Mental Status: He is alert and oriented to person, place, and time.     Cranial Nerves: No cranial nerve deficit.     Deep Tendon Reflexes: Reflexes normal.        Assessment:     #1 physical exam.  We discussed the following health maintenance issues as below  #2 gout.  History of frequent flareups past few years.  Currently not on prophylaxis.    Plan:     -Strongly recommended some weight loss especially in view of his family history of type 2 diabetes  -Check lab work.  Include hepatitis C antibody.  We discussed pros and cons of PSA testing and he would like to get that as well  -Vaccines up-to-date.  Will need Pneumovax by age 64  -We discussed prophylaxis for gout with allopurinol and recommend starting 100 mg daily for 2 weeks then increase to 200 mg daily for 2 weeks then increase to 300 mg daily.  We have also recommended he take colchicine 0.6 mg 1 daily during buildup of allopurinol as prophylaxis  -Would consider follow-up uric acid level within a few months at follow-up visit and also transition over to 300 mg dose allopurinol if tolerating well  Kristian Covey MD [facility] at [facility]

## 2019-11-03 NOTE — Telephone Encounter (Signed)
Please advise 

## 2019-11-03 NOTE — Telephone Encounter (Signed)
done

## 2019-11-03 NOTE — Telephone Encounter (Signed)
This has been faxed.

## 2019-11-03 NOTE — Patient Instructions (Signed)
Preventive Care 41-64 Years Old, Male Preventive care refers to lifestyle choices and visits with your health care provider that can promote health and wellness. This includes:  A yearly physical exam. This is also called an annual well check.  Regular dental and eye exams.  Immunizations.  Screening for certain conditions.  Healthy lifestyle choices, such as eating a healthy diet, getting regular exercise, not using drugs or products that contain nicotine and tobacco, and limiting alcohol use. What can I expect for my preventive care visit? Physical exam Your health care provider will check:  Height and weight. These may be used to calculate body mass index (BMI), which is a measurement that tells if you are at a healthy weight.  Heart rate and blood pressure.  Your skin for abnormal spots. Counseling Your health care provider may ask you questions about:  Alcohol, tobacco, and drug use.  Emotional well-being.  Home and relationship well-being.  Sexual activity.  Eating habits.  Work and work Statistician. What immunizations do I need?  Influenza (flu) vaccine  This is recommended every year. Tetanus, diphtheria, and pertussis (Tdap) vaccine  You may need a Td booster every 10 years. Varicella (chickenpox) vaccine  You may need this vaccine if you have not already been vaccinated. Zoster (shingles) vaccine  You may need this after age 64. Measles, mumps, and rubella (MMR) vaccine  You may need at least one dose of MMR if you were born in 1957 or later. You may also need a second dose. Pneumococcal conjugate (PCV13) vaccine  You may need this if you have certain conditions and were not previously vaccinated. Pneumococcal polysaccharide (PPSV23) vaccine  You may need one or two doses if you smoke cigarettes or if you have certain conditions. Meningococcal conjugate (MenACWY) vaccine  You may need this if you have certain conditions. Hepatitis A  vaccine  You may need this if you have certain conditions or if you travel or work in places where you may be exposed to hepatitis A. Hepatitis B vaccine  You may need this if you have certain conditions or if you travel or work in places where you may be exposed to hepatitis B. Haemophilus influenzae type b (Hib) vaccine  You may need this if you have certain risk factors. Human papillomavirus (HPV) vaccine  If recommended by your health care provider, you may need three doses over 6 months. You may receive vaccines as individual doses or as more than one vaccine together in one shot (combination vaccines). Talk with your health care provider about the risks and benefits of combination vaccines. What tests do I need? Blood tests  Lipid and cholesterol levels. These may be checked every 5 years, or more frequently if you are over 60 years old.  Hepatitis C test.  Hepatitis B test. Screening  Lung cancer screening. You may have this screening every year starting at age 43 if you have a 30-pack-year history of smoking and currently smoke or have quit within the past 15 years.  Prostate cancer screening. Recommendations will vary depending on your family history and other risks.  Colorectal cancer screening. All adults should have this screening starting at age 72 and continuing until age 2. Your health care provider may recommend screening at age 14 if you are at increased risk. You will have tests every 1-10 years, depending on your results and the type of screening test.  Diabetes screening. This is done by checking your blood sugar (glucose) after you have not eaten  for a while (fasting). You may have this done every 1-3 years.  Sexually transmitted disease (STD) testing. Follow these instructions at home: Eating and drinking  Eat a diet that includes fresh fruits and vegetables, whole grains, lean protein, and low-fat dairy products.  Take vitamin and mineral supplements as  recommended by your health care provider.  Do not drink alcohol if your health care provider tells you not to drink.  If you drink alcohol: ? Limit how much you have to 0-2 drinks a day. ? Be aware of how much alcohol is in your drink. In the [place] one drink equals one 12 oz bottle of beer (355 mL), one 5 oz glass of wine (148 mL), or one 1 oz glass of hard liquor (44 mL). Lifestyle  Take daily care of your teeth and gums.  Stay active. Exercise for at least 30 minutes on 5 or more days each week.  Do not use any products that contain nicotine or tobacco, such as cigarettes, e-cigarettes, and chewing tobacco. If you need help quitting, ask your health care provider.  If you are sexually active, practice safe sex. Use a condom or other form of protection to prevent STIs (sexually transmitted infections).  Talk with your health care provider about taking a low-dose aspirin every day starting at age 38. What's next?  Go to your health care provider once a year for a well check visit.  Ask your health care provider how often you should have your eyes and teeth checked.  Stay up to date on all vaccines. This information is not intended to replace advice given to you by your health care provider. Make sure you discuss any questions you have with your health care provider. Document Revised: 06/23/2018 Document Reviewed: 06/23/2018 Elsevier Patient Education  [place].  Take the Colchicine one daily while building up dose of the Allopurinol

## 2019-11-06 LAB — HEPATITIS C ANTIBODY
Hepatitis C Ab: NONREACTIVE
SIGNAL TO CUT-OFF: 0.08 (ref <1.00)

## 2019-11-19 ENCOUNTER — Other Ambulatory Visit: Payer: Self-pay | Admitting: Family Medicine

## 2019-12-27 ENCOUNTER — Other Ambulatory Visit: Payer: Self-pay | Admitting: Internal Medicine

## 2020-01-18 ENCOUNTER — Other Ambulatory Visit: Payer: Self-pay | Admitting: Family Medicine

## 2020-02-14 ENCOUNTER — Other Ambulatory Visit: Payer: Self-pay | Admitting: Family Medicine

## 2020-05-22 ENCOUNTER — Telehealth (INDEPENDENT_AMBULATORY_CARE_PROVIDER_SITE_OTHER): Payer: BC Managed Care – PPO | Admitting: Family Medicine

## 2020-05-22 ENCOUNTER — Encounter: Payer: Self-pay | Admitting: Family Medicine

## 2020-05-22 ENCOUNTER — Telehealth: Payer: Self-pay

## 2020-05-22 DIAGNOSIS — R21 Rash and other nonspecific skin eruption: Secondary | ICD-10-CM | POA: Diagnosis not present

## 2020-05-22 MED ORDER — TRIAMCINOLONE ACETONIDE 0.1 % EX CREA: 1.0000 "application " | TOPICAL_CREAM | Freq: Two times a day (BID) | CUTANEOUS | 1 refills | Status: AC | PRN

## 2020-05-22 NOTE — Telephone Encounter (Signed)
Called pt back to verify the name of the medication and to see if it was prescribed by Dr. Caryl Never.  Pt stated that Dr. Caryl Never does not handle his ADHD medication (desvenlasaxin) but he did contact the provider that did and they told him to contact his PCP for the allergic reaction the rash that he has on his arms and legs.  Pt only want to be seen for the rash so that he is able to go back to work.  Pt has a virtual appointment today at 5:00 to see Dr. Caryl Never.

## 2020-05-22 NOTE — Telephone Encounter (Signed)
Called pt back regarding message for medication , ask patient the name of medication that was causing the reaction. I to the patient that Dr.Burchette was not the one how wrote the rx for this medication , he will need to contract the provider who provide this medication for him. Pt started to Cussing , I told pt I would speak with if going to use that language and I disconnected the call. Spoke Tonya to let know what happen.

## 2020-05-22 NOTE — Progress Notes (Signed)
Patient ID: Ruben King, male   DOB: [birth year], 64 y.o.   MRN: [id number]  This visit type was conducted due to national recommendations for restrictions regarding the COVID-19 pandemic in an effort to limit this patient's exposure and mitigate transmission in our community.   Virtual Visit via Telephone Note  I connected with Ruben King on 05/22/20 at  5:00 PM EST by telephone and verified that I am speaking with the correct person using two identifiers.   I discussed the limitations, risks, security and privacy concerns of performing an evaluation and management service by telephone and the availability of in person appointments. I also discussed with the patient that there may be a patient responsible charge related to this service. The patient expressed understanding and agreed to proceed.  Location patient: home Location provider: work or home office Participants present for the call: patient, provider Patient did not have a visit in the prior 7 days to address this/these issue(s).   History of Present Illness: Ruben King called regarding possible adverse event from medication prescribed by his psychiatrist.  He was recently placed on Pristiq about 4 weeks ago.  He developed rash on his right forearm and left leg.  This is pruritic.  He describes this as "bumps ".  He has not taken any antihistamine.  He tried some hydrocortisone cream over-the-counter which has helped some.  No angioedema symptoms.  No systemic rash in terms of trunk.  No fevers or chills.  Past Medical History:  Diagnosis Date  . ADHD (attention deficit hyperactivity disorder)   . Allergy   . Anemia    once in 20's  . Anxiety   . Cataract    removed right eye  . Constipation   . Depression   . Gout   . Kidney stones 2000  . Migraines   . Sleep apnea    wears cpap    Past Surgical History:  Procedure Laterality Date  . BACK SURGERY  2001  . COLONOSCOPY     11 +yrs ago   . KNEE SURGERY Bilateral    X  3  . LITHOTRIPSY  2000   saw Urology in [place]  . REPLACEMENT TOTAL KNEE Right   . sleep apnea surgery    . TONSILLECTOMY  1961    reports that he quit smoking about 11 years ago. His smoking use included cigarettes. He has a 2.50 pack-year smoking history. He has never used smokeless tobacco. He reports current alcohol use. He reports that he does not use drugs. family history includes Arthritis in his mother; Cancer in his father; Diabetes in his maternal grandmother and mother; Esophageal cancer in his father; Heart disease in his maternal grandfather and maternal grandmother; Heart disease (age of onset: 4) in his mother. Allergies  Allergen Reactions  . Penicillins Anaphylaxis    Did it involve swelling of the face/tongue/throat, SOB, or low BP? No Did it involve sudden or severe rash/hives, skin peeling, or any reaction on the inside of your mouth or nose? Yes Did you need to seek medical attention at a hospital or doctor's office? Yes When did it last happen?Childhood If all above answers are "NO", may proceed with cephalosporin use.  . Sulfa Antibiotics     As a child      Observations/Objective: Patient sounds cheerful and well on the phone. I do not appreciate any SOB. Speech and thought processing are grossly intact. Patient reported vitals:  Assessment and Plan:  Patient reports rash which is confined  to the right forearm and left leg after starting Pristiq.  He is concerned that this is a drug reaction.  -He has been advised already by a psychiatrist to discontinue Pristiq -Consider once daily over-the-counter antihistamine -Triamcinolone 0.1% cream twice daily as needed for pruritus -If not clearing over the next couple weeks recommend office follow-up to further assess  Follow Up Instructions:  - as needed   [id number] [id number] [id number] 11-20 [id number] 21-30 I did not refer this patient for an OV in the next 24 hours for this/these issue(s).  I discussed the  assessment and treatment plan with the patient. The patient was provided an opportunity to ask questions and all were answered. The patient agreed with the plan and demonstrated an understanding of the instructions.   The patient was advised to call back or seek an in-person evaluation if the symptoms worsen or if the condition fails to improve as anticipated.  I provided 12 minutes of non-face-to-face time during this encounter.   Evelena Peat, MD

## 2020-05-22 NOTE — Telephone Encounter (Signed)
Patient called in stating that the medication that was giving for his HDHD is giving him side effects. He has developed a rash on the right arm and left leg and has tried using a topical cream that he doesn't feel is working. What can be done or does he need to switch medications   Desdenlasaxin-E Succnter 50mg    Please call and advise

## 2020-09-17 IMAGING — CR DG FOOT COMPLETE 3+V*R*
3 series · 3 of 3 positions shown · non-contrast
Comparison: None.

CLINICAL DATA: Pain at the fifth metatarsal base for 3 days. No
known injury

EXAM:
RIGHT FOOT COMPLETE - 3+ VIEW

[x foot ap right]
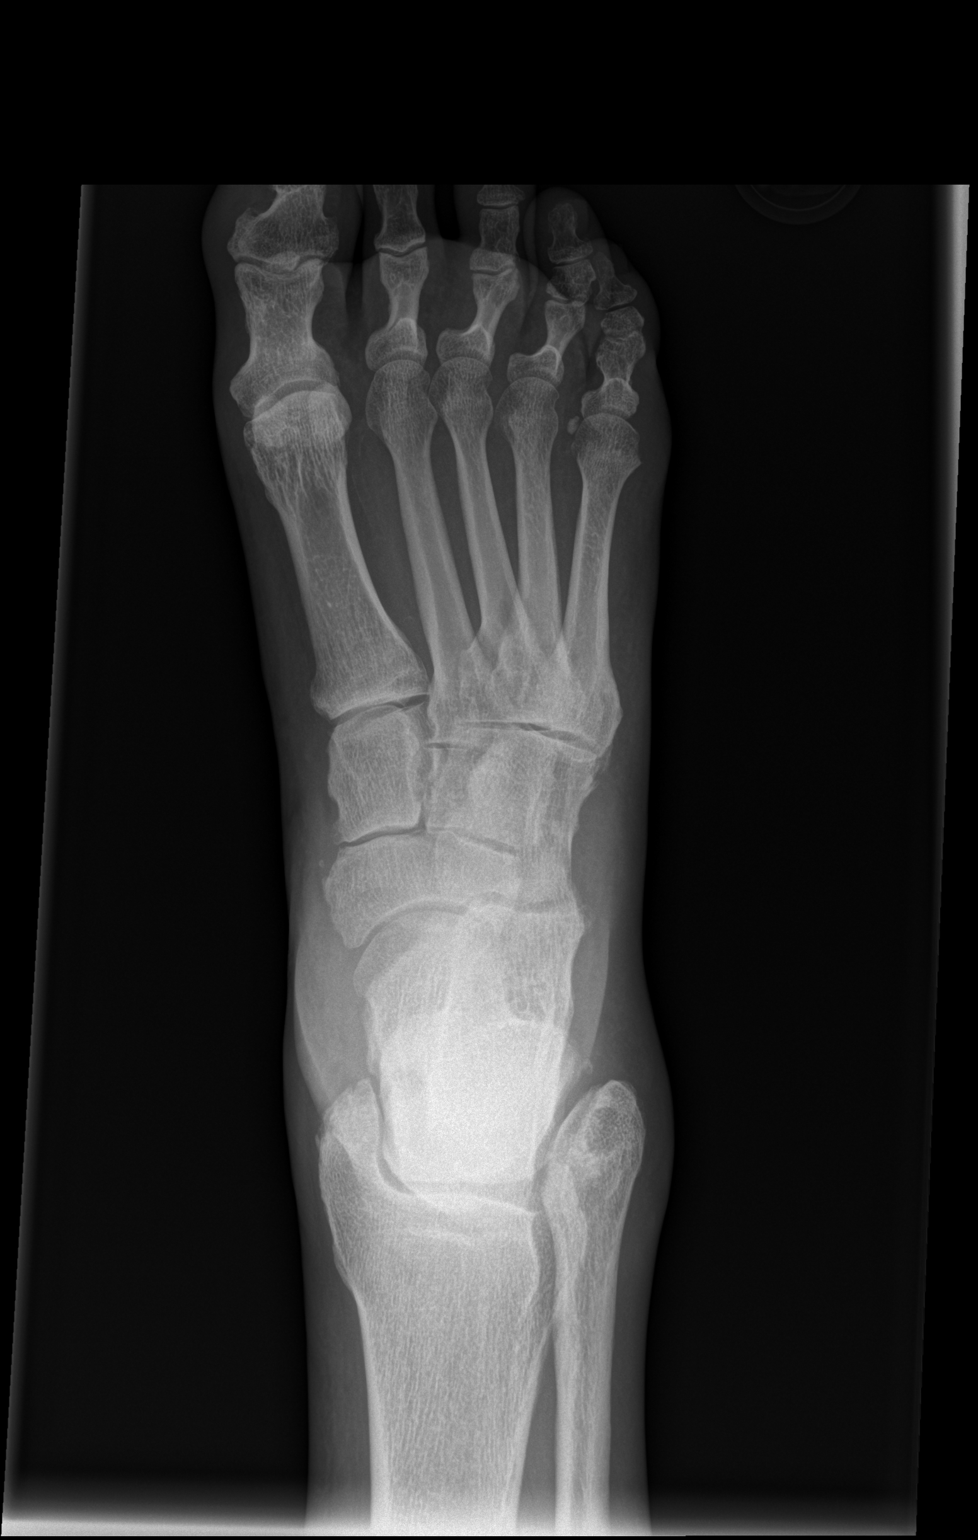

[x foot obl right]
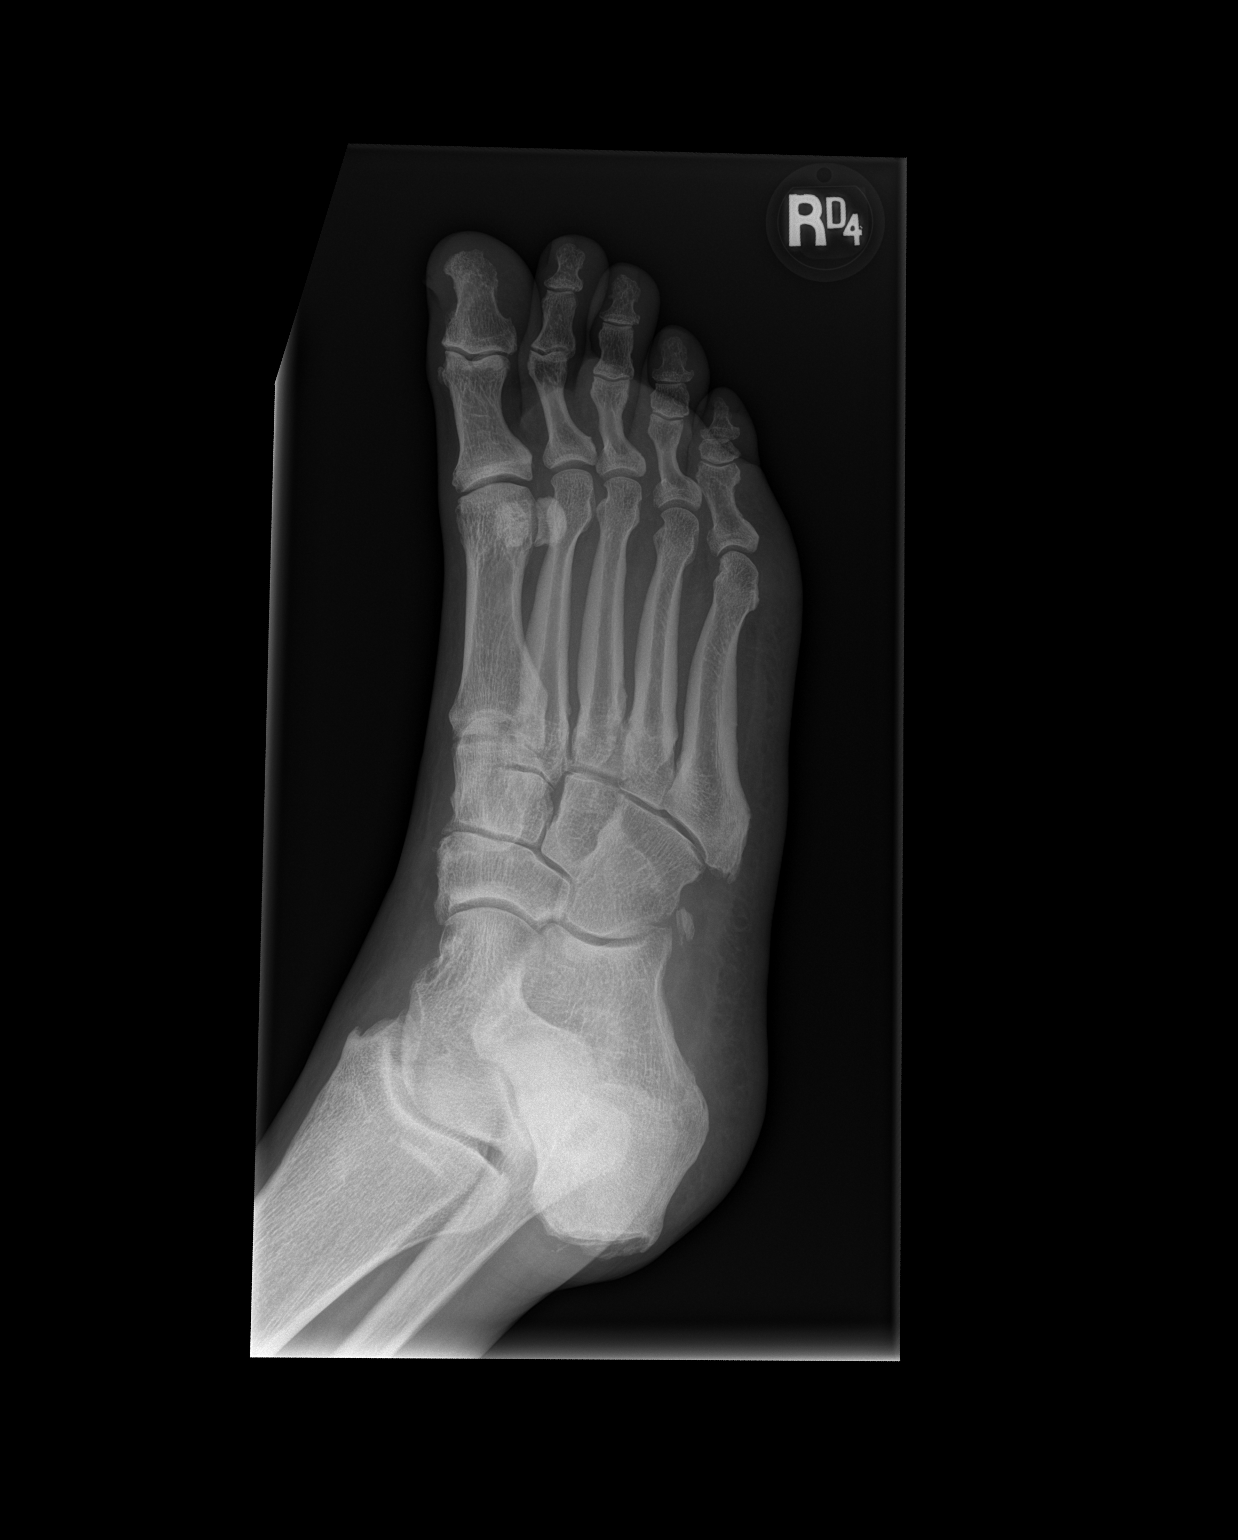

[x foot lat right]
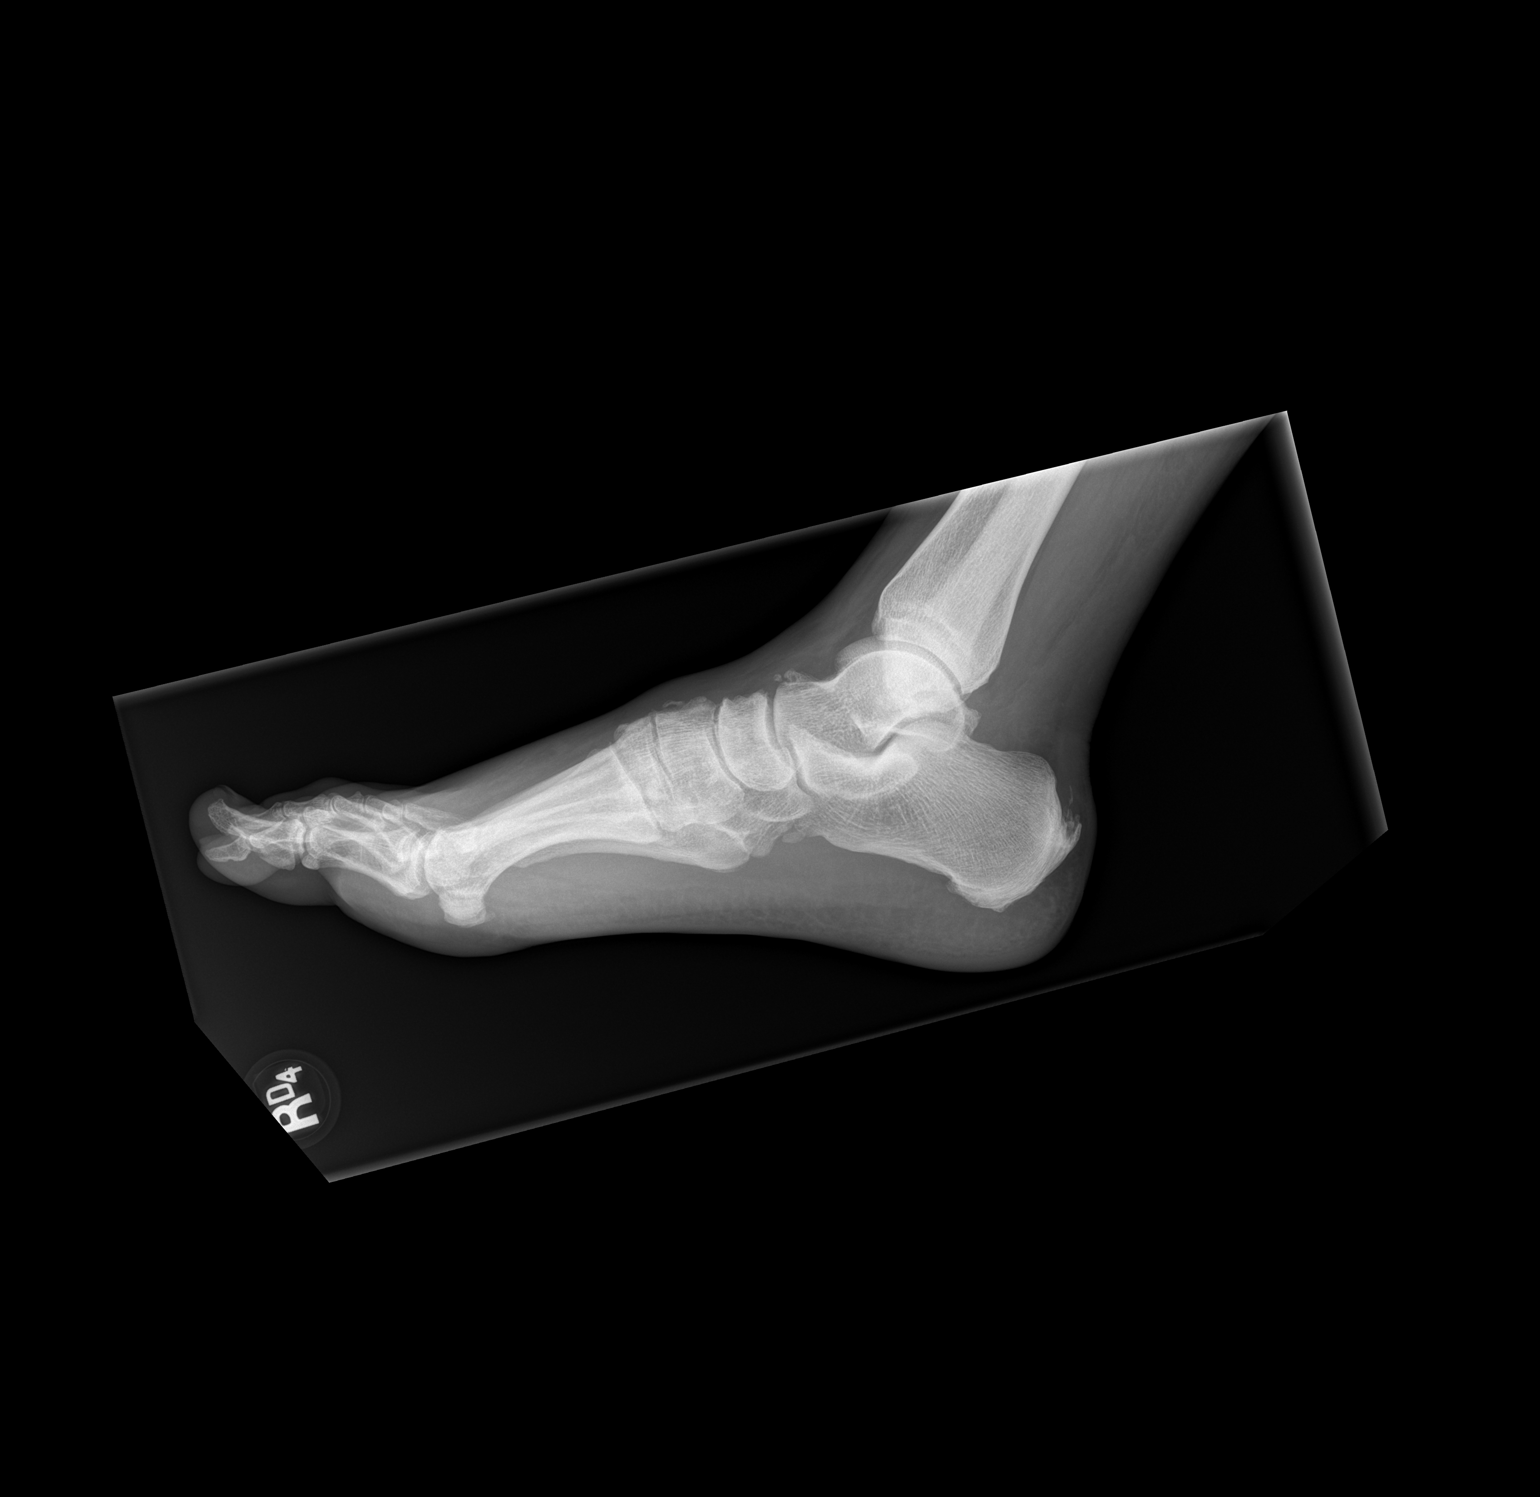

[3 of 3 positions shown; findings below may reference images not displayed]

FINDINGS: There is no evidence of fracture or dislocation. Enthesopathic
changes are noted at the fifth metatarsal base. Small os peroneum.
Small bidirectional calcaneal enthesophytes. Mild dorsal capsular
hypertrophy at the talonavicular joint. There is no evidence of
arthropathy or other focal bone abnormality. Soft tissues are
unremarkable.
IMPRESSION: 1. No acute osseous abnormality.
2. Mild enthesopathic changes at the fifth metatarsal base.

## 2020-12-03 ENCOUNTER — Other Ambulatory Visit: Payer: Self-pay | Admitting: Family Medicine

## 2023-08-16 ENCOUNTER — Ambulatory Visit: Payer: BC Managed Care – PPO | Admitting: Internal Medicine

## 2023-09-22 ENCOUNTER — Ambulatory Visit: Payer: BC Managed Care – PPO | Admitting: Internal Medicine

## 2023-09-24 DIAGNOSIS — E785 Hyperlipidemia, unspecified: Secondary | ICD-10-CM | POA: Diagnosis not present

## 2023-09-24 DIAGNOSIS — R7989 Other specified abnormal findings of blood chemistry: Secondary | ICD-10-CM | POA: Diagnosis not present

## 2023-09-24 DIAGNOSIS — Z136 Encounter for screening for cardiovascular disorders: Secondary | ICD-10-CM | POA: Diagnosis not present

## 2023-09-24 DIAGNOSIS — G4733 Obstructive sleep apnea (adult) (pediatric): Secondary | ICD-10-CM | POA: Diagnosis not present

## 2023-09-24 DIAGNOSIS — R011 Cardiac murmur, unspecified: Secondary | ICD-10-CM | POA: Diagnosis not present

## 2023-09-24 DIAGNOSIS — Z79899 Other long term (current) drug therapy: Secondary | ICD-10-CM | POA: Diagnosis not present

## 2023-09-24 DIAGNOSIS — M109 Gout, unspecified: Secondary | ICD-10-CM | POA: Diagnosis not present

## 2023-09-24 DIAGNOSIS — Z0000 Encounter for general adult medical examination without abnormal findings: Secondary | ICD-10-CM | POA: Diagnosis not present

## 2023-09-24 DIAGNOSIS — R600 Localized edema: Secondary | ICD-10-CM | POA: Diagnosis not present

## 2023-09-24 DIAGNOSIS — Z0189 Encounter for other specified special examinations: Secondary | ICD-10-CM | POA: Diagnosis not present

## 2023-10-02 DIAGNOSIS — Z87891 Personal history of nicotine dependence: Secondary | ICD-10-CM | POA: Diagnosis not present

## 2023-10-02 DIAGNOSIS — Z881 Allergy status to other antibiotic agents status: Secondary | ICD-10-CM | POA: Diagnosis not present

## 2023-10-02 DIAGNOSIS — Z882 Allergy status to sulfonamides status: Secondary | ICD-10-CM | POA: Diagnosis not present

## 2023-10-02 DIAGNOSIS — Z880 Allergy status to penicillin: Secondary | ICD-10-CM | POA: Diagnosis not present

## 2023-10-02 DIAGNOSIS — L03116 Cellulitis of left lower limb: Secondary | ICD-10-CM | POA: Diagnosis not present

## 2023-10-02 DIAGNOSIS — Z79899 Other long term (current) drug therapy: Secondary | ICD-10-CM | POA: Diagnosis not present

## 2023-10-02 DIAGNOSIS — M1990 Unspecified osteoarthritis, unspecified site: Secondary | ICD-10-CM | POA: Diagnosis not present

## 2023-10-02 DIAGNOSIS — R600 Localized edema: Secondary | ICD-10-CM | POA: Diagnosis not present
